# Patient Record
Sex: Female | Born: 1980 | Race: Black or African American | Hispanic: No | Marital: Married | Smoking: Never smoker
Health system: Southern US, Community
[De-identification: ages and names within clinical notes are randomized; demographics above are authoritative.]

## PROBLEM LIST (undated history)

## (undated) ENCOUNTER — Inpatient Hospital Stay (HOSPITAL_COMMUNITY): Payer: Self-pay

## (undated) DIAGNOSIS — J40 Bronchitis, not specified as acute or chronic: Secondary | ICD-10-CM

## (undated) DIAGNOSIS — O26899 Other specified pregnancy related conditions, unspecified trimester: Secondary | ICD-10-CM

## (undated) DIAGNOSIS — B977 Papillomavirus as the cause of diseases classified elsewhere: Secondary | ICD-10-CM

## (undated) DIAGNOSIS — R42 Dizziness and giddiness: Secondary | ICD-10-CM

## (undated) DIAGNOSIS — R12 Heartburn: Secondary | ICD-10-CM

## (undated) DIAGNOSIS — D649 Anemia, unspecified: Secondary | ICD-10-CM

## (undated) DIAGNOSIS — R87619 Unspecified abnormal cytological findings in specimens from cervix uteri: Secondary | ICD-10-CM

## (undated) DIAGNOSIS — O139 Gestational [pregnancy-induced] hypertension without significant proteinuria, unspecified trimester: Secondary | ICD-10-CM

## (undated) DIAGNOSIS — IMO0002 Reserved for concepts with insufficient information to code with codable children: Secondary | ICD-10-CM

---
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
## 2003-05-12 ENCOUNTER — Emergency Department (HOSPITAL_COMMUNITY): Admission: EM | Admit: 2003-05-12 | Discharge: 2003-05-13 | Payer: Self-pay | Admitting: Emergency Medicine

## 2005-09-13 ENCOUNTER — Ambulatory Visit (HOSPITAL_COMMUNITY): Admission: RE | Admit: 2005-09-13 | Discharge: 2005-09-13 | Payer: Self-pay | Admitting: Family Medicine

## 2005-09-28 ENCOUNTER — Inpatient Hospital Stay (HOSPITAL_COMMUNITY): Admission: AD | Admit: 2005-09-28 | Discharge: 2005-09-28 | Payer: Self-pay | Admitting: Family Medicine

## 2005-10-23 ENCOUNTER — Ambulatory Visit (HOSPITAL_COMMUNITY): Admission: RE | Admit: 2005-10-23 | Discharge: 2005-10-23 | Payer: Self-pay | Admitting: Family Medicine

## 2005-11-20 ENCOUNTER — Ambulatory Visit (HOSPITAL_COMMUNITY): Admission: RE | Admit: 2005-11-20 | Discharge: 2005-11-20 | Payer: Self-pay | Admitting: Family Medicine

## 2006-01-25 ENCOUNTER — Ambulatory Visit: Payer: Self-pay | Admitting: Obstetrics and Gynecology

## 2006-01-25 ENCOUNTER — Inpatient Hospital Stay (HOSPITAL_COMMUNITY): Admission: AD | Admit: 2006-01-25 | Discharge: 2006-01-25 | Payer: Self-pay | Admitting: Family Medicine

## 2006-03-21 ENCOUNTER — Inpatient Hospital Stay (HOSPITAL_COMMUNITY): Admission: AD | Admit: 2006-03-21 | Discharge: 2006-03-24 | Payer: Self-pay | Admitting: Obstetrics and Gynecology

## 2006-03-21 ENCOUNTER — Ambulatory Visit: Payer: Self-pay | Admitting: Obstetrics and Gynecology

## 2009-07-31 ENCOUNTER — Emergency Department (HOSPITAL_COMMUNITY): Admission: EM | Admit: 2009-07-31 | Discharge: 2009-07-31 | Payer: Self-pay | Admitting: Emergency Medicine

## 2010-02-21 ENCOUNTER — Emergency Department (HOSPITAL_COMMUNITY)
Admission: EM | Admit: 2010-02-21 | Discharge: 2010-02-21 | Payer: Self-pay | Source: Home / Self Care | Admitting: Emergency Medicine

## 2010-02-27 NOTE — L&D Delivery Note (Signed)
Delivery Note At 10:12 PM a viable female was delivered via Vaginal, Spontaneous Delivery (Presentation: Right Occiput Anterior).  APGAR: 9, 9; weight .   Placenta status: Intact, Spontaneous.  Cord:  with the following complications: none   Anesthesia: Epidural  Episiotomy: None Lacerations: 1st degree Suture Repair: 2.0 vicryl rapide Est. Blood Loss (mL): 250 ml  Mom to postpartum.  Baby to nursery-stable.  JACKSON-MOORE,Bernell Haynie A 01/04/2011, 10:33 PM

## 2010-05-16 LAB — DIFFERENTIAL
Basophils Relative: 1 % (ref 0–1)
Eosinophils Relative: 2 % (ref 0–5)
Lymphocytes Relative: 32 % (ref 12–46)
Monocytes Absolute: 0.7 10*3/uL (ref 0.1–1.0)
Neutro Abs: 2.5 10*3/uL (ref 1.7–7.7)
Neutrophils Relative %: 52 % (ref 43–77)

## 2010-05-16 LAB — CBC
Hemoglobin: 10.6 g/dL — ABNORMAL LOW (ref 12.0–15.0)
MCHC: 32.5 g/dL (ref 30.0–36.0)
Platelets: 284 10*3/uL (ref 150–400)
RDW: 17.4 % — ABNORMAL HIGH (ref 11.5–15.5)
WBC: 4.8 10*3/uL (ref 4.0–10.5)

## 2010-05-16 LAB — COMPREHENSIVE METABOLIC PANEL
Albumin: 3.7 g/dL (ref 3.5–5.2)
CO2: 24 mEq/L (ref 19–32)
Chloride: 109 mEq/L (ref 96–112)
Creatinine, Ser: 0.7 mg/dL (ref 0.4–1.2)
GFR calc Af Amer: >60 mL/min (ref >60)
GFR calc non Af Amer: >60 mL/min (ref >60)
Glucose, Bld: 113 mg/dL — ABNORMAL HIGH (ref 70–99)
Total Bilirubin: 0.3 mg/dL (ref 0.3–1.2)

## 2010-05-16 LAB — URINALYSIS, ROUTINE W REFLEX MICROSCOPIC
Bilirubin Urine: NEGATIVE
Glucose, UA: NEGATIVE mg/dL
Nitrite: NEGATIVE
Protein, ur: NEGATIVE mg/dL
Urobilinogen, UA: 1 mg/dL (ref 0.0–1.0)
pH: 6 (ref 5.0–8.0)

## 2010-05-16 LAB — URINE MICROSCOPIC-ADD ON

## 2010-05-25 ENCOUNTER — Inpatient Hospital Stay (HOSPITAL_COMMUNITY)
Admission: AD | Admit: 2010-05-25 | Discharge: 2010-05-25 | Disposition: A | Discharge status: Home / Self Care | Payer: Medicaid Other | Source: Ambulatory Visit | Attending: Obstetrics and Gynecology | Admitting: Obstetrics and Gynecology

## 2010-05-25 DIAGNOSIS — O210 Mild hyperemesis gravidarum: Secondary | ICD-10-CM | POA: Insufficient documentation

## 2010-05-25 DIAGNOSIS — A5901 Trichomonal vulvovaginitis: Secondary | ICD-10-CM

## 2010-05-25 DIAGNOSIS — O98819 Other maternal infectious and parasitic diseases complicating pregnancy, unspecified trimester: Secondary | ICD-10-CM | POA: Insufficient documentation

## 2010-05-25 LAB — WET PREP, GENITAL: Yeast Wet Prep HPF POC: NONE SEEN

## 2010-05-25 LAB — URINALYSIS, ROUTINE W REFLEX MICROSCOPIC
Specific Gravity, Urine: 1.015 (ref 1.005–1.030)
Urobilinogen, UA: 0.2 mg/dL (ref 0.0–1.0)

## 2010-05-25 LAB — POCT PREGNANCY, URINE: Preg Test, Ur: POSITIVE

## 2010-05-26 LAB — GC/CHLAMYDIA PROBE AMP, GENITAL: GC Probe Amp, Genital: NEGATIVE

## 2010-06-21 LAB — RPR: RPR: NONREACTIVE

## 2010-06-21 LAB — ABO/RH

## 2010-06-21 LAB — HEPATITIS B SURFACE ANTIGEN: Hepatitis B Surface Ag: NEGATIVE

## 2010-07-15 NOTE — Op Note (Signed)
Anna Neal, Anna Neal            ACCOUNT NO.:  [id number]   MEDICAL RECORD NO.:  [id number]          PATIENT TYPE:  INP   LOCATION:  9163                          FACILITY:  [facility]   PHYSICIAN:  Phil D. Okey Dupre, M.D.     DATE OF BIRTH:  December 02, 1980   DATE OF PROCEDURE:  03/22/2006  DATE OF DISCHARGE:                               OPERATIVE REPORT   PROCEDURE:  Low forceps delivery with left sulcus laceration repair.   PREOPERATIVE DIAGNOSES:  Term pregnancy, severe fetal bradycardia.   POSTOPERATIVE DIAGNOSES:  Term pregnancy, severe fetal bradycardia.   OBSTETRICIAN:  Phil D. Okey Dupre, M.D.   ASSISTANT:  Sylvan Cheese, M.D.   ANESTHESIA:  Epidural.   ESTIMATED BLOOD LOSS:  500 mL.   SPECIMENS TO PATHOLOGY:  None.   REASON FOR DELIVERY:  I was called to see the patient who was fully  dilated with the fetal heart down into the 70s, not corrected by  positional change, increased fluid or oxygen.  On examination the  patient was completely effaced, completely dilated with a vertex in a  ROT presentation and a -2 station.  I had the patient push very hard and  the patient was able to bring the vertex down to a +1.  I then applied  the Luikhart McClain forceps and using a Bill's traction bar easily  delivered the vertex.  The cord was doubly clamped, divided.  The baby  handed to the nurse and samples of blood taken from the cord for  analysis and pH and the placenta spontaneously removed.  Vagina was  explored.  There was a significant left lateral sulcus laceration  extending up 1 cm above the ischial spine that was closed with  continuous running 0 chromic suture on an atraumatic needle down to the  perineum.  The Area was observed for bleeding.  None was noted.  I was  worried about some oozing perhaps behind the repair so I put in a Foley  catheter and a Kerlix pack for several hours to prevent bleeding.  The  patient was in satisfactory condition at the end of the procedure as was  the baby.           ______________________________  Javier Glazier. Okey Dupre, M.D.     PDR/MEDQ  D:  03/22/2006  T:  03/22/2006  Job:  [id number]

## 2010-09-12 LAB — HIV ANTIBODY (ROUTINE TESTING W REFLEX): HIV: NONREACTIVE

## 2010-11-01 ENCOUNTER — Encounter (HOSPITAL_COMMUNITY): Payer: Self-pay | Admitting: *Deleted

## 2010-11-01 ENCOUNTER — Inpatient Hospital Stay (HOSPITAL_COMMUNITY)
Admission: AD | Admit: 2010-11-01 | Discharge: 2010-11-01 | Disposition: A | Discharge status: Home / Self Care | Payer: Medicaid Other | Source: Ambulatory Visit | Attending: Obstetrics | Admitting: Obstetrics

## 2010-11-01 DIAGNOSIS — R55 Syncope and collapse: Secondary | ICD-10-CM

## 2010-11-01 DIAGNOSIS — O9989 Other specified diseases and conditions complicating pregnancy, childbirth and the puerperium: Secondary | ICD-10-CM | POA: Insufficient documentation

## 2010-11-01 DIAGNOSIS — D649 Anemia, unspecified: Secondary | ICD-10-CM

## 2010-11-01 DIAGNOSIS — E860 Dehydration: Secondary | ICD-10-CM

## 2010-11-01 DIAGNOSIS — O99019 Anemia complicating pregnancy, unspecified trimester: Secondary | ICD-10-CM | POA: Insufficient documentation

## 2010-11-01 DIAGNOSIS — R42 Dizziness and giddiness: Secondary | ICD-10-CM | POA: Insufficient documentation

## 2010-11-01 HISTORY — DX: Papillomavirus as the cause of diseases classified elsewhere: B97.7

## 2010-11-01 HISTORY — DX: Dizziness and giddiness: R42

## 2010-11-01 HISTORY — DX: Bronchitis, not specified as acute or chronic: J40

## 2010-11-01 LAB — URINALYSIS, ROUTINE W REFLEX MICROSCOPIC
Glucose, UA: NEGATIVE mg/dL
Hgb urine dipstick: NEGATIVE
Ketones, ur: 15 mg/dL — AB
Leukocytes, UA: NEGATIVE
Protein, ur: NEGATIVE mg/dL
Specific Gravity, Urine: >1.03 — ABNORMAL HIGH (ref 1.005–1.030)
Urobilinogen, UA: 1 mg/dL (ref 0.0–1.0)

## 2010-11-01 LAB — CBC
HCT: 28.5 % — ABNORMAL LOW (ref 36.0–46.0)
MCHC: 30.5 g/dL (ref 30.0–36.0)
Platelets: 255 10*3/uL (ref 150–400)
RDW: 16.7 % — ABNORMAL HIGH (ref 11.5–15.5)

## 2010-11-01 MED ORDER — FERROUS SULFATE 325 (65 FE) MG PO TABS: 325.0000 mg | ORAL_TABLET | Freq: Three times a day (TID) | ORAL | Status: DC

## 2010-11-01 NOTE — Progress Notes (Signed)
Pt c/o lightheaded and dizziness x2 months. No abdominal pain or vaginal bleeding.

## 2010-11-01 NOTE — ED Provider Notes (Signed)
History   Pt presents today c/o dizzy spells. She states this has been happening on and off for the past month. She states she was bending over at work earlier today and when she stood up she became lightheaded and dizzy. She denies LOC. She denies abd pain, vag dc, bleeding, or any other sx at this time. She reports GFM.  Chief Complaint  Patient presents with  . Dizziness   HPI  OB History    Grav Para Term Preterm Abortions TAB SAB Ect Mult Living   2 1 1       1       Past Medical History  Diagnosis Date  . Bronchitis   . Vertigo   . HPV in female     Past Surgical History  Procedure Date  . No past surgeries     No family history on file.  History  Substance Use Topics  . Smoking status: Never Smoker   . Smokeless tobacco: Not on file  . Alcohol Use: No    Allergies: Allergies not on file  No prescriptions prior to admission    Review of Systems  Constitutional: Positive for malaise/fatigue. Negative for fever.  Eyes: Negative for blurred vision and double vision.  Cardiovascular: Negative for chest pain and palpitations.  Gastrointestinal: Negative for nausea, vomiting, abdominal pain and diarrhea.  Genitourinary: Negative for dysuria, urgency, frequency and hematuria.  Neurological: Positive for dizziness. Negative for tingling, sensory change, seizures, loss of consciousness and headaches.  Psychiatric/Behavioral: Negative for depression and suicidal ideas.   Physical Exam   Blood pressure 117/65, pulse 104, temperature 98.6 F (37 C), temperature source Oral, resp. rate 20, height 5\' 2"  (1.575 m), weight 224 lb (101.606 kg), SpO2 97.00%.  Physical Exam  Constitutional: She is oriented to person, place, and time. She appears well-developed and well-nourished. No distress.  HENT:  Head: Normocephalic and atraumatic.  Eyes: EOM are normal. Pupils are equal, round, and reactive to light.  Cardiovascular: Normal rate, regular rhythm and normal heart  sounds.  Exam reveals no gallop and no friction rub.   No murmur heard. Respiratory: Effort normal and breath sounds normal. No respiratory distress. She has no wheezes. She has no rales. She exhibits no tenderness.  GI: Soft. She exhibits no distension. There is no tenderness. There is no rebound and no guarding.  Neurological: She is alert and oriented to person, place, and time.  Skin: Skin is warm and dry. She is not diaphoretic.  Psychiatric: She has a normal mood and affect. Her behavior is normal. Judgment and thought content normal.    MAU Course  Procedures  Results for orders placed during the hospital encounter of 11/01/10 (from the past 24 hour(s))  URINALYSIS, ROUTINE W REFLEX MICROSCOPIC     Status: Abnormal   Collection Time   11/01/10 11:00 AM      Component Value Range   Color, Urine YELLOW  YELLOW    Appearance CLEAR  CLEAR    Specific Gravity, Urine >1.030 (*) 1.005 - 1.030    pH 6.0  5.0 - 8.0    Glucose, UA NEGATIVE  NEGATIVE (mg/dL)   Hgb urine dipstick NEGATIVE  NEGATIVE    Bilirubin Urine NEGATIVE  NEGATIVE    Ketones, ur 15 (*) NEGATIVE (mg/dL)   Protein, ur NEGATIVE  NEGATIVE (mg/dL)   Urobilinogen, UA 1.0  0.0 - 1.0 (mg/dL)   Nitrite NEGATIVE  NEGATIVE    Leukocytes, UA NEGATIVE  NEGATIVE   CBC  Status: Abnormal   Collection Time   11/01/10 11:38 AM      Component Value Range   WBC 8.7  4.0 - 10.5 (K/uL)   RBC 3.68 (*) 3.87 - 5.11 (MIL/uL)   Hemoglobin 8.7 (*) 12.0 - 15.0 (g/dL)   HCT 13.0 (*) 86.5 - 46.0 (%)   MCV 77.4 (*) 78.0 - 100.0 (fL)   MCH 23.6 (*) 26.0 - 34.0 (pg)   MCHC 30.5  30.0 - 36.0 (g/dL)   RDW 78.4 (*) 69.6 - 15.5 (%)   Platelets 255  150 - 400 (K/uL)     Assessment and Plan  Preg with dizziness: Pt likely had a vasovagal reaction this am. However, pt is also anemic and dehydrated. Will begin iron supplementation. Discussed adequate hydration. She has a f/u scheduled with Dr. Clearance Coots for [place] tomorrow. Discussed diet, activity,  risks, and precautions.  Clinton Gallant. Murdis Flitton III, DrHSc, MPAS, PA-C  11/01/2010, 11:41 AM   Henrietta Hoover, PA 11/01/10 1202

## 2010-12-05 LAB — STREP B DNA PROBE: GBS: NEGATIVE

## 2011-01-03 ENCOUNTER — Other Ambulatory Visit: Payer: Self-pay | Admitting: Obstetrics & Gynecology

## 2011-01-04 ENCOUNTER — Encounter (HOSPITAL_COMMUNITY): Payer: Self-pay

## 2011-01-04 ENCOUNTER — Encounter (HOSPITAL_COMMUNITY): Payer: Self-pay | Admitting: Anesthesiology

## 2011-01-04 ENCOUNTER — Inpatient Hospital Stay (HOSPITAL_COMMUNITY)
Admission: RE | Admit: 2011-01-04 | Discharge: 2011-01-06 | DRG: 775 | Disposition: A | Discharge status: Home / Self Care | Payer: Medicaid Other | Source: Ambulatory Visit | Attending: Obstetrics & Gynecology | Admitting: Obstetrics & Gynecology

## 2011-01-04 ENCOUNTER — Inpatient Hospital Stay (HOSPITAL_COMMUNITY): Payer: Medicaid Other | Admitting: Anesthesiology

## 2011-01-04 DIAGNOSIS — O481 Prolonged pregnancy: Secondary | ICD-10-CM | POA: Diagnosis present

## 2011-01-04 HISTORY — DX: Reserved for concepts with insufficient information to code with codable children: IMO0002

## 2011-01-04 HISTORY — DX: Unspecified abnormal cytological findings in specimens from cervix uteri: R87.619

## 2011-01-04 LAB — CBC
HCT: 33.3 % — ABNORMAL LOW (ref 36.0–46.0)
MCH: 25.7 pg — ABNORMAL LOW (ref 26.0–34.0)
MCV: 81.4 fL (ref 78.0–100.0)
RBC: 4.09 MIL/uL (ref 3.87–5.11)
WBC: 10 10*3/uL (ref 4.0–10.5)

## 2011-01-04 LAB — ABO/RH: RH Type: POSITIVE

## 2011-01-04 MED ORDER — DIPHENHYDRAMINE HCL 50 MG/ML IJ SOLN: 12.5000 mg | INTRAMUSCULAR | Status: DC | PRN

## 2011-01-04 MED ORDER — OXYTOCIN BOLUS FROM INFUSION
500.0000 mL | Freq: Once | INTRAVENOUS | Status: DC
  Filled 2011-01-04: qty 500

## 2011-01-04 MED ORDER — ONDANSETRON HCL 4 MG/2ML IJ SOLN: 4.0000 mg | Freq: Four times a day (QID) | INTRAMUSCULAR | Status: DC | PRN

## 2011-01-04 MED ORDER — LACTATED RINGERS IV SOLN
500.0000 mL | Freq: Once | INTRAVENOUS | Status: AC
  Administered 2011-01-04: 1000 mL via INTRAVENOUS

## 2011-01-04 MED ORDER — LIDOCAINE HCL 1.5 % IJ SOLN
INTRAMUSCULAR | Status: DC | PRN
  Administered 2011-01-04 (×2): 4 mL via EPIDURAL

## 2011-01-04 MED ORDER — LIDOCAINE HCL (PF) 1 % IJ SOLN
30.0000 mL | INTRAMUSCULAR | Status: DC | PRN
  Administered 2011-01-04: 30 mL via SUBCUTANEOUS
  Filled 2011-01-04 (×2): qty 30

## 2011-01-04 MED ORDER — FENTANYL 2.5 MCG/ML BUPIVACAINE 1/10 % EPIDURAL INFUSION (WH - ANES)
INTRAMUSCULAR | Status: DC | PRN
Start: 2011-01-04 — End: 2011-01-05
  Administered 2011-01-04: 13 mL/h via EPIDURAL

## 2011-01-04 MED ORDER — PHENYLEPHRINE 40 MCG/ML (10ML) SYRINGE FOR IV PUSH (FOR BLOOD PRESSURE SUPPORT)
80.0000 ug | PREFILLED_SYRINGE | INTRAVENOUS | Status: DC | PRN
  Filled 2011-01-04 (×2): qty 5

## 2011-01-04 MED ORDER — FLEET ENEMA 7-19 GM/118ML RE ENEM: 1.0000 | ENEMA | RECTAL | Status: DC | PRN

## 2011-01-04 MED ORDER — EPHEDRINE 5 MG/ML INJ
10.0000 mg | INTRAVENOUS | Status: DC | PRN
  Filled 2011-01-04 (×2): qty 4

## 2011-01-04 MED ORDER — BUTORPHANOL TARTRATE 2 MG/ML IJ SOLN: 1.0000 mg | INTRAMUSCULAR | Status: DC | PRN

## 2011-01-04 MED ORDER — IBUPROFEN 600 MG PO TABS
600.0000 mg | ORAL_TABLET | Freq: Four times a day (QID) | ORAL | Status: DC | PRN
  Administered 2011-01-05: 600 mg via ORAL
  Filled 2011-01-04: qty 1

## 2011-01-04 MED ORDER — OXYTOCIN 20 UNITS IN LACTATED RINGERS INFUSION - SIMPLE
1.0000 m[IU]/min | INTRAVENOUS | Status: DC
  Administered 2011-01-04: 2 m[IU]/min via INTRAVENOUS
  Administered 2011-01-04: 8 m[IU]/min via INTRAVENOUS
  Administered 2011-01-04: 6 m[IU]/min via INTRAVENOUS
  Filled 2011-01-04: qty 1000

## 2011-01-04 MED ORDER — OXYCODONE-ACETAMINOPHEN 5-325 MG PO TABS: 2.0000 | ORAL_TABLET | ORAL | Status: DC | PRN

## 2011-01-04 MED ORDER — CITRIC ACID-SODIUM CITRATE 334-500 MG/5ML PO SOLN: 30.0000 mL | ORAL | Status: DC | PRN

## 2011-01-04 MED ORDER — FENTANYL 2.5 MCG/ML BUPIVACAINE 1/10 % EPIDURAL INFUSION (WH - ANES)
14.0000 mL/h | INTRAMUSCULAR | Status: DC
  Administered 2011-01-04 (×2): 14 mL/h via EPIDURAL
  Filled 2011-01-04 (×3): qty 60

## 2011-01-04 MED ORDER — LACTATED RINGERS IV SOLN
500.0000 mL | INTRAVENOUS | Status: DC | PRN
  Administered 2011-01-04: 500 mL via INTRAVENOUS

## 2011-01-04 MED ORDER — LACTATED RINGERS IV SOLN
INTRAVENOUS | Status: DC
  Administered 2011-01-04: 15:00:00 via INTRAVENOUS

## 2011-01-04 MED ORDER — OXYTOCIN 20 UNITS IN LACTATED RINGERS INFUSION - SIMPLE
125.0000 mL/h | Freq: Once | INTRAVENOUS | Status: AC
  Administered 2011-01-04: 125 mL/h via INTRAVENOUS

## 2011-01-04 MED ORDER — ACETAMINOPHEN 325 MG PO TABS: 650.0000 mg | ORAL_TABLET | ORAL | Status: DC | PRN

## 2011-01-04 MED ORDER — PHENYLEPHRINE 40 MCG/ML (10ML) SYRINGE FOR IV PUSH (FOR BLOOD PRESSURE SUPPORT)
80.0000 ug | PREFILLED_SYRINGE | INTRAVENOUS | Status: DC | PRN
  Filled 2011-01-04: qty 5

## 2011-01-04 MED ORDER — TERBUTALINE SULFATE 1 MG/ML IJ SOLN: 0.2500 mg | Freq: Once | INTRAMUSCULAR | Status: AC | PRN

## 2011-01-04 MED ORDER — EPHEDRINE 5 MG/ML INJ
10.0000 mg | INTRAVENOUS | Status: DC | PRN
  Filled 2011-01-04: qty 4

## 2011-01-04 NOTE — Anesthesia Procedure Notes (Signed)
Epidural Patient location during procedure: OB Start time: 01/04/2011 12:25 PM  Staffing Anesthesiologist: Wang Granada A. Performed by: anesthesiologist   Preanesthetic Checklist Completed: patient identified, site marked, surgical consent, pre-op evaluation, timeout performed, IV checked, risks and benefits discussed and monitors and equipment checked  Epidural Patient position: sitting Prep: site prepped and draped and DuraPrep Patient monitoring: continuous pulse ox and blood pressure Approach: midline Injection technique: LOR air  Needle:  Needle type: Tuohy  Needle gauge: 17 G Needle length: 9 cm Needle insertion depth: 7 cm Catheter type: closed end flexible Catheter size: 19 Gauge Catheter at skin depth: 12 cm Test dose: negative and 1.5% lidocaine  Assessment Events: blood not aspirated, injection not painful, no injection resistance, negative IV test and no paresthesia  Additional Notes Patient is more comfortable after epidural dosed. Please see RN's note for documentation of vital signs and FHR which are stable.

## 2011-01-04 NOTE — H&P (Signed)
Anna Neal is a 30 y.o. female presenting for IOL. Maternal Medical History:  Reason for admission: Reason for Admission:   nauseaIOL.  Favorable Bishop's score.  Fetal activity: Perceived fetal activity is normal.    Prenatal complications: no prenatal complications   OB History    Grav Para Term Preterm Abortions TAB SAB Ect Mult Living   2 1 1       1      Past Medical History  Diagnosis Date  . Bronchitis   . Vertigo   . HPV in female   . Bronchitis   . Abnormal Pap smear    Past Surgical History  Procedure Date  . No past surgeries    Family History: family history is not on file. Social History:  reports that she has never smoked. She does not have any smokeless tobacco history on file. She reports that she does not drink alcohol or use illicit drugs.  Review of Systems  Constitutional: Negative for fever.  Eyes: Negative for blurred vision.  Respiratory: Negative for shortness of breath.   Gastrointestinal: Negative for nausea and vomiting.  Skin: Negative for rash.  Neurological: Negative for headaches.    Dilation: 8 Effacement (%): 80 Station: -2 Exam by:: dherr rn Blood pressure 114/76, pulse 86, temperature 98.2 F (36.8 C), temperature source Oral, resp. rate 20, height 5\' 2"  (1.575 m), weight 104.781 kg (231 lb), SpO2 98.00%. Maternal Exam:  Uterine Assessment: Contraction strength is mild.  Contraction frequency is irregular.   Abdomen: Patient reports no abdominal tenderness. Fetal presentation: vertex  Introitus: not evaluated.     Fetal Exam Fetal Monitor Review: Variability: moderate (6-25 bpm).   Pattern: accelerations present and no decelerations.    Fetal State Assessment: Category I - tracings are normal.     Physical Exam  Constitutional: She appears well-developed.  HENT:  Head: Normocephalic.  Neck: Neck supple. No thyromegaly present.  Cardiovascular: Normal rate and regular rhythm.   Respiratory: Breath sounds  normal.  GI: Soft. Bowel sounds are normal.  Skin: No rash noted.    Prenatal labs: ABO, Rh: --/Positive/-- (11/07 1608) Antibody: Negative (11/07 1608) Rubella: Immune (04/24 0000) RPR: NON REACTIVE (11/07 0710)  HBsAg: Negative (04/24 0000)  HIV: Non-reactive (07/16 0000)  GBS: Negative (10/08 0000)   Assessment/Plan: 30 y.o.  primipara at [redacted]w[redacted]d.  Favorable Bishop's score.  Admit AROM/Pitocin Anticipate an NSVD Anna Neal A 01/04/2011, 5:45 PM

## 2011-01-04 NOTE — Anesthesia Preprocedure Evaluation (Signed)
Anesthesia Evaluation  Patient identified by MRN, date of birth, ID band Patient awake    Reviewed: Allergy & Precautions, H&P , Patient's Chart, lab work & pertinent test results  Airway Mallampati: III TM Distance: >3 FB Neck ROM: full    Dental No notable dental hx. (+) Teeth Intact   Pulmonary neg pulmonary ROS,  clear to auscultation  Pulmonary exam normal       Cardiovascular neg cardio ROS regular Normal    Neuro/Psych Negative Neurological ROS  Negative Psych ROS   GI/Hepatic negative GI ROS, Neg liver ROS,   Endo/Other  Negative Endocrine ROSMorbid obesity  Renal/GU negative Renal ROS  Genitourinary negative   Musculoskeletal   Abdominal   Peds  Hematology negative hematology ROS (+)   Anesthesia Other Findings   Reproductive/Obstetrics (+) Pregnancy                           Anesthesia Physical Anesthesia Plan  ASA: III  Anesthesia Plan: Epidural   Post-op Pain Management:    Induction:   Airway Management Planned:   Additional Equipment:   Intra-op Plan:   Post-operative Plan:   Informed Consent: I have reviewed the patients History and Physical, chart, labs and discussed the procedure including the risks, benefits and alternatives for the proposed anesthesia with the patient or authorized representative who has indicated his/her understanding and acceptance.     Plan Discussed with: Anesthesiologist and Surgeon  Anesthesia Plan Comments:         Anesthesia Quick Evaluation

## 2011-01-04 NOTE — Progress Notes (Signed)
Updated on SVE. Stated to try a few pushes and if no movement to labor down until 2130

## 2011-01-05 LAB — CBC
HCT: 30.5 % — ABNORMAL LOW (ref 36.0–46.0)
Hemoglobin: 9.8 g/dL — ABNORMAL LOW (ref 12.0–15.0)
WBC: 15.2 10*3/uL — ABNORMAL HIGH (ref 4.0–10.5)

## 2011-01-05 MED ORDER — DIBUCAINE 1 % RE OINT: 1.0000 "application " | TOPICAL_OINTMENT | RECTAL | Status: DC | PRN

## 2011-01-05 MED ORDER — ZOLPIDEM TARTRATE 5 MG PO TABS: 5.0000 mg | ORAL_TABLET | Freq: Every evening | ORAL | Status: DC | PRN

## 2011-01-05 MED ORDER — WITCH HAZEL-GLYCERIN EX PADS: 1.0000 "application " | MEDICATED_PAD | CUTANEOUS | Status: DC | PRN

## 2011-01-05 MED ORDER — MEDROXYPROGESTERONE ACETATE 150 MG/ML IM SUSP: 150.0000 mg | INTRAMUSCULAR | Status: DC | PRN

## 2011-01-05 MED ORDER — FERROUS SULFATE 325 (65 FE) MG PO TABS
325.0000 mg | ORAL_TABLET | Freq: Two times a day (BID) | ORAL | Status: DC
  Administered 2011-01-05 – 2011-01-06 (×3): 325 mg via ORAL
  Filled 2011-01-05 (×4): qty 1

## 2011-01-05 MED ORDER — DIPHENHYDRAMINE HCL 25 MG PO CAPS: 25.0000 mg | ORAL_CAPSULE | Freq: Four times a day (QID) | ORAL | Status: DC | PRN

## 2011-01-05 MED ORDER — BENZOCAINE-MENTHOL 20-0.5 % EX AERO: 1.0000 "application " | INHALATION_SPRAY | CUTANEOUS | Status: DC | PRN

## 2011-01-05 MED ORDER — MEASLES, MUMPS & RUBELLA VAC ~~LOC~~ INJ
0.5000 mL | INJECTION | Freq: Once | SUBCUTANEOUS | Status: DC
  Filled 2011-01-05: qty 0.5

## 2011-01-05 MED ORDER — OXYCODONE-ACETAMINOPHEN 5-325 MG PO TABS: 1.0000 | ORAL_TABLET | ORAL | Status: DC | PRN

## 2011-01-05 MED ORDER — ONDANSETRON HCL 4 MG PO TABS: 4.0000 mg | ORAL_TABLET | ORAL | Status: DC | PRN

## 2011-01-05 MED ORDER — MAGNESIUM HYDROXIDE 400 MG/5ML PO SUSP: 30.0000 mL | ORAL | Status: DC | PRN

## 2011-01-05 MED ORDER — PRENATAL PLUS 27-1 MG PO TABS
1.0000 | ORAL_TABLET | Freq: Every day | ORAL | Status: DC
  Administered 2011-01-05 – 2011-01-06 (×2): 1 via ORAL
  Filled 2011-01-05 (×2): qty 1

## 2011-01-05 MED ORDER — ONDANSETRON HCL 4 MG/2ML IJ SOLN: 4.0000 mg | INTRAMUSCULAR | Status: DC | PRN

## 2011-01-05 MED ORDER — LANOLIN HYDROUS EX OINT: TOPICAL_OINTMENT | CUTANEOUS | Status: DC | PRN

## 2011-01-05 MED ORDER — TETANUS-DIPHTH-ACELL PERTUSSIS 5-2.5-18.5 LF-MCG/0.5 IM SUSP
0.5000 mL | Freq: Once | INTRAMUSCULAR | Status: AC
  Administered 2011-01-05: 0.5 mL via INTRAMUSCULAR
  Filled 2011-01-05: qty 0.5

## 2011-01-05 MED ORDER — IBUPROFEN 600 MG PO TABS
600.0000 mg | ORAL_TABLET | Freq: Four times a day (QID) | ORAL | Status: DC
  Administered 2011-01-05 – 2011-01-06 (×5): 600 mg via ORAL
  Filled 2011-01-05 (×5): qty 1

## 2011-01-05 MED ORDER — SENNOSIDES-DOCUSATE SODIUM 8.6-50 MG PO TABS
2.0000 | ORAL_TABLET | Freq: Every day | ORAL | Status: DC
  Administered 2011-01-05: 2 via ORAL

## 2011-01-05 NOTE — Anesthesia Postprocedure Evaluation (Signed)
  Anesthesia Post-op Note  Patient: Anna Neal  Procedure(s) Performed: * No procedures listed *  Patient Location: Mother/Baby  Anesthesia Type: Epidural  Level of Consciousness: awake  Airway and Oxygen Therapy: Patient Spontanous Breathing  Post-op Pain: none  Post-op Assessment: Patient's Cardiovascular Status Stable, Respiratory Function Stable, Patent Airway, No signs of Nausea or vomiting, Adequate PO intake and Pain level controlled  Post-op Vital Signs: Reviewed and stable  Complications: No apparent anesthesia complications

## 2011-01-05 NOTE — Progress Notes (Signed)
UR chart review completed.  

## 2011-01-05 NOTE — Progress Notes (Signed)
Post Partum Day 1 S/P spontaneous vaginal RH status/Rubella reviewed.  Feeding: breast Subjective: No HA, SOB, CP, F/C, breast symptoms. Normal vaginal bleeding, no clots.     Objective: BP 107/70  Pulse 93  Temp(Src) 98.4 F (36.9 C) (Oral)  Resp 20  Ht 5\' 2"  (1.575 m)  Wt 104.781 kg (231 lb)  BMI 42.25 kg/m2  SpO2 100%  Breastfeeding? Unknown   Physical Exam:  General: alert Lochia: appropriate Uterine Fundus: firm DVT Evaluation: No evidence of DVT seen on physical exam. Ext: No c/c/e  Basename 01/05/11 0510 01/04/11 0710  HGB 9.8* 10.5*  HCT 30.5* 33.3*      Assessment/Plan: 30 y.o.  PPD #1 .  normal postpartum exam Continue current postpartum care Ambulate   LOS: 1 day   JACKSON-MOORE,Hudson Lehmkuhl A 01/05/2011, 7:08 AM

## 2011-01-06 MED ORDER — OXYCODONE-ACETAMINOPHEN 5-325 MG PO TABS: 1.0000 | ORAL_TABLET | ORAL | Status: AC | PRN

## 2011-01-06 NOTE — Discharge Summary (Signed)
Obstetric Discharge Summary Reason for Admission: onset of labor Prenatal Procedures: none Intrapartum Procedures: spontaneous vaginal delivery Postpartum Procedures: none Complications-Operative and Postpartum: none Hemoglobin  Date Value Range Status  01/05/2011 9.8* 12.0-15.0 (g/dL) Final     HCT  Date Value Range Status  01/05/2011 30.5* 36.0-46.0 (%) Final    Discharge Diagnoses: Term Pregnancy-delivered  Discharge Information: Date: 01/06/2011 Activity: pelvic rest Diet: routine Medications: Percocet Condition: stable Instructions: refer to practice specific booklet Discharge to: home Follow-up Information    Follow up with Antionette Char A, MD. Call in 6 weeks.   Contact information:   [address], [address] [place] [address] [phone]          Newborn Data: Live born female  Birth Weight: 7 lb 8.6 oz (3420 g) APGAR: 9, 9  Home with mother.  Zavian Slowey A 01/06/2011, 7:29 AM

## 2012-12-18 ENCOUNTER — Encounter: Payer: Self-pay | Admitting: Obstetrics

## 2012-12-18 ENCOUNTER — Ambulatory Visit (INDEPENDENT_AMBULATORY_CARE_PROVIDER_SITE_OTHER): Payer: Medicaid Other | Admitting: Obstetrics & Gynecology

## 2012-12-18 VITALS — BP 120/84 | Temp 98.2°F | Wt 231.0 lb

## 2012-12-18 DIAGNOSIS — O99211 Obesity complicating pregnancy, first trimester: Secondary | ICD-10-CM

## 2012-12-18 DIAGNOSIS — Z3480 Encounter for supervision of other normal pregnancy, unspecified trimester: Secondary | ICD-10-CM | POA: Insufficient documentation

## 2012-12-18 DIAGNOSIS — Z3201 Encounter for pregnancy test, result positive: Secondary | ICD-10-CM

## 2012-12-18 DIAGNOSIS — Z23 Encounter for immunization: Secondary | ICD-10-CM

## 2012-12-18 DIAGNOSIS — E669 Obesity, unspecified: Secondary | ICD-10-CM

## 2012-12-18 DIAGNOSIS — O99210 Obesity complicating pregnancy, unspecified trimester: Secondary | ICD-10-CM | POA: Insufficient documentation

## 2012-12-18 DIAGNOSIS — Z3481 Encounter for supervision of other normal pregnancy, first trimester: Secondary | ICD-10-CM

## 2012-12-18 LAB — POCT URINALYSIS DIPSTICK
Bilirubin, UA: NEGATIVE
Ketones, UA: NEGATIVE
Protein, UA: NEGATIVE
Spec Grav, UA: 1.015

## 2012-12-18 MED ORDER — CITRANATAL HARMONY 30-1-260 MG PO CAPS: 1.0000 | ORAL_CAPSULE | Freq: Every day | ORAL | Status: DC

## 2012-12-18 NOTE — Progress Notes (Signed)
Pulse- 86 . Subjective:    Anna Neal is being seen today for her first obstetrical visit.  This is not a planned pregnancy. She is at [redacted]w[redacted]d gestation. Her obstetrical history is significant for obesity. Relationship with FOB: spouse, living together. Patient does intend to breast feed. Pregnancy history fully reviewed.  Menstrual History: OB History   Grav Para Term Preterm Abortions TAB SAB Ect Mult Living   3 2 2       2       Menarche age: 32  Patient's last menstrual period was 09/24/2012.    The following portions of the patient's history were reviewed and updated as appropriate: allergies, current medications, past family history, past medical history, past social history, past surgical history and problem list.  Review of Systems Pertinent items are noted in HPI.    Objective:   General Appearance:    Alert, cooperative, no distress, appears stated age  Head:    Normocephalic, without obvious abnormality, atraumatic  Eyes:    PERRL, conjunctiva/corneas clear, EOM's intact, fundi    benign, both eyes  Ears:    Normal TM's and external ear canals, both ears  Nose:   Nares normal, septum midline, mucosa normal, no drainage    or sinus tenderness  Throat:   Lips, mucosa, and tongue normal; teeth and gums normal  Neck:   Supple, symmetrical, trachea midline, no adenopathy;    thyroid:  no enlargement/tenderness/nodules; no carotid   bruit or JVD  Back:     Symmetric, no curvature, ROM normal, no CVA tenderness  Lungs:     Clear to auscultation bilaterally, respirations unlabored  Chest Wall:    No tenderness or deformity   Heart:    Regular rate and rhythm, S1 and S2 normal, no murmur, rub   or gallop  Breast Exam:    No tenderness, masses, or nipple abnormality  Abdomen:     Soft, non-tender, bowel sounds active all four quadrants,    no masses, no organomegaly  Genitalia:    Normal female without lesion, discharge or tenderness  Extremities:   Extremities normal,  atraumatic, no cyanosis or edema  Pulses:   2+ and symmetric all extremities  Skin:   Skin color, texture, turgor normal, no rashes or lesions  Lymph nodes:   Cervical, supraclavicular, and axillary nodes normal  Neurologic:   CNII-XII intact, normal strength, sensation and reflexes    throughout    Assessment:    Pregnancy at [redacted]w[redacted]d weeks    Plan:    Initial labs drawn. Prenatal vitamins.  Counseling provided regarding continued use of seat belts, cessation of alcohol consumption, smoking or use of illicit drugs; infection precautions i.e., influenza/TDAP immunizations, toxoplasmosis,CMV, parvovirus, listeria and varicella; workplace safety, exercise during pregnancy; routine dental care, safe medications, sexual activity, hot tubs, saunas, pools, travel, caffeine use, fish and methlymercury, potential toxins, hair treatments, varicose veins Weight gain recommendations reviewed: underweight/BMI< 18.5--> gain 28 - 40 lbs; normal weight/BMI 18.5 - 24.9--> gain 25 - 35 lbs; overweight/BMI 25 - 29.9--> gain 15 - 25 lbs; obese/BMI >30->gain  11 - 20 lbs Problem list reviewed and updated. FIRST/CF mutation testing declined Role of ultrasound in pregnancy discussed. Amniocentesis discussed: not indicated. Follow up in 6 weeks. 50% of 20 min visit spent on counseling and coordination of care.

## 2012-12-18 NOTE — Patient Instructions (Signed)
Prenatal Care  WHAT IS PRENATAL CARE?  Prenatal care means health care during your pregnancy, before your baby is born. Take care of yourself and your baby by:   Getting early prenatal care. If you know you are pregnant, or think you might be pregnant, call your caregiver as soon as possible. Schedule a visit for a general/prenatal examination.  Getting regular prenatal care. Follow your caregiver's schedule for blood and other necessary tests. Do not miss appointments.  Do everything you can to keep yourself and your baby healthy during your pregnancy.  Prenatal care should include evaluation of medical, dietary, educational, psychological, and social needs for the couple and the medical, surgical, and genetic history of the family of the mother and father.  Discuss with your caregiver:  Your medicines, prescription, over-the-counter, and herbal medicines.  Substance abuse, alcohol, smoking, and illegal drugs.  Domestic abuse and violence, if present.  Your immunizations.  Nutrition and diet.  Exercising.  Environment and occupational hazards, at home and at work.  History of sexually transmitted disease, both you and your partner.  Previous pregnancies. WHY IS PRENATAL CARE SO IMPORTANT?  By seeing you regularly, your caregiver has the chance to find problems early, so that they can be treated as soon as possible. Other problems might be prevented. Many studies have shown that early and regular prenatal care is important for the health of both mothers and their babies.  I AM THINKING ABOUT GETTING PREGNANT. HOW CAN I TAKE CARE OF MYSELF?  Taking care of yourself before you get pregnant helps you to have a healthy pregnancy. It also lowers your chances of having a baby born with a birth defect. Here are ways to take care of yourself before you get pregnant:   Eat healthy foods, exercise regularly (30 minutes per day for most days of the week is best), and get enough rest and  sleep. Talk to your caregiver about what kinds of foods and exercises are best for you.  Take 400 micrograms (mcg) of folic acid (one of the B vitamins) every day. The best way to do this is to take a daily multivitamin pill that contains this amount of folic acid. Getting enough of the synthetic (manufactured) form of folic acid every day before you get pregnant and during early pregnancy can help prevent certain birth defects. Many breakfast cereals and other grain products have folic acid added to them, but only certain cereals contain 400 mcg of folic acid per serving. Check the label on your multivitamin or cereal to find the amount of folic acid in the food.  See your caregiver for a complete check up before getting pregnant. Make sure that you have had all your immunization shots, especially for rubella ([place] measles). Rubella can cause serious birth defects. Chickenpox is another illness you want to avoid during pregnancy. If you have had chickenpox and rubella in the past, you should be immune to them.  Tell your caregiver about any prescription or non-prescription medicines (including herbal remedies) you are taking. Some medicines are not safe to take during pregnancy.  Stop smoking cigarettes, drinking alcohol, or taking illegal drugs. Ask your caregiver for help, if you need it. You can also get help with alcohol and drugs by talking with a member of your faith community, a counselor, or a trusted friend.  Discuss and treat any medical, social, or psychological problems before getting pregnant.  Discuss any history of genetic problems in the mother, father, and their families. Do  genetic testing before getting pregnant, when possible.  Discuss any physical or emotional abuse with your caregiver.  Discuss with your caregiver if you might be exposed to harmful chemicals on your job or where you live.  Discuss with your caregiver if you think your job or the hours you work may be  harmful and should be changed.  The father should be involved with the decision making and with all aspects of the pregnancy, labor, and delivery.  If you have medical insurance, make sure you are covered for pregnancy. I JUST FOUND OUT THAT I AM PREGNANT. HOW CAN I TAKE CARE OF MYSELF?  Here are ways to take care of yourself and the precious new life growing inside you:   Continue taking your multivitamin with 400 micrograms (mcg) of folic acid every day.  Get early and regular prenatal care. It does not matter if this is your first pregnancy or if you already have children. It is very important to see a caregiver during your pregnancy. Your caregiver will check at each visit to make sure that you and the baby are healthy. If there are any problems, action can be taken right away to help you and the baby.  Eat a healthy diet that includes:  Fruits.  Vegetables.  Foods low in saturated fat.  Grains.  Calcium-rich foods.  Drink 6 to 8 glasses of liquids a day.  Unless your caregiver tells you not to, try to be physically active for 30 minutes, most days of the week. If you are pressed for time, you can get your activity in through 10 minute segments, three times a day.  If you smoke, drink alcohol, or use drugs, STOP. These can cause long-term damage to your baby. Talk with your caregiver about steps to take to stop smoking. Talk with a member of your faith community, a counselor, a trusted friend, or your caregiver if you are concerned about your alcohol or drug use.  Ask your caregiver before taking any medicine, even over-the-counter medicines. Some medicines are not safe to take during pregnancy.  Get plenty of rest and sleep.  Avoid hot tubs and saunas during pregnancy.  Do not have X-rays taken, unless absolutely necessary and with the recommendation of your caregiver. A lead shield can be placed on your abdomen, to protect the baby when X-rays are taken in other parts of the  body.  Do not empty the cat litter when you are pregnant. It may contain a parasite that causes an infection called toxoplasmosis, which can cause birth defects. Also, use gloves when working in garden areas used by cats.  Do not eat uncooked or undercooked cheese, meats, or fish.  Stay away from toxic chemicals like:  Insecticides.  Solvents (some cleaners or paint thinners).  Lead.  Mercury.  Sexual relations may continue until the end of the pregnancy, unless you have a medical problem or there is a problem with the pregnancy and your caregiver tells you not to.  Do not wear high heel shoes, especially during the second half of the pregnancy. You can lose your balance and fall.  Do not take long trips, unless absolutely necessary. Be sure to see your caregiver before going on the trip.  Do not sit in one position for more than 2 hours, when on a trip.  Take a copy of your medical records when going on a trip.  Know where there is a hospital in the city you are visiting, in case of an  emergency.  Most dangerous household products will have pregnancy warnings on their labels. Ask your caregiver about products if you are unsure.  Limit or eliminate your caffeine intake from coffee, tea, sodas, medicines, and chocolate.  Many women continue working through pregnancy. Staying active might help you stay healthier. If you have a question about the safety or the hours you work at your particular job, talk with your caregiver.  Get informed:  Read books.  Watch videos.  Go to childbirth classes for you and the father.  Talk with experienced moms.  Ask your caregiver about childbirth education classes for you and your partner. Classes can help you and your partner prepare for the birth of your baby.  Ask about a pediatrician (baby doctor) and methods and pain medicine for labor, delivery, and possible Cesarean delivery (C-section). I AM NOT THINKING ABOUT GETTING PREGNANT  RIGHT NOW, BUT HEARD THAT ALL WOMEN SHOULD TAKE FOLIC ACID EVERY DAY?  All women of childbearing age, with even a remote chance of getting pregnant, should try to make sure they get enough folic acid. Many pregnancies are not planned. Many women do not know they are actually pregnant early in their pregnancies, and certain birth defects happen in the very early part of pregnancy. Taking 400 micrograms (mcg) of folic acid every day will help prevent certain birth defects that happen in the early part of pregnancy. If a woman begins taking vitamin pills in the second or third month of pregnancy, it may be too late to prevent birth defects. Folic acid may also have other health benefits for women, besides preventing birth defects.  HOW OFTEN SHOULD I SEE MY CAREGIVER DURING PREGNANCY?  Your caregiver will give you a schedule for your prenatal visits. You will have visits more often as you get closer to the end of your pregnancy. An average pregnancy lasts about 40 weeks.  A typical schedule includes visiting your caregiver:   About once each month, during your first 6 months of pregnancy.  Every 2 weeks, during the next 2 months.  Weekly in the last month, until the delivery date. Your caregiver will probably want to see you more often if:  You are over 35.  Your pregnancy is high risk, because you have certain health problems or problems with the pregnancy, such as:  Diabetes.  High blood pressure.  The baby is not growing on schedule, according to the dates of the pregnancy. Your caregiver will do special tests, to make sure you and the baby are not having any serious problems. WHAT HAPPENS DURING PRENATAL VISITS?   At your first prenatal visit, your caregiver will talk to you about you and your partner's health history and your family's health history, and will do a physical exam.  On your first visit, a physical exam will include checks of your blood pressure, height and weight, and an  exam of your pelvic organs. Your caregiver will do a Pap test if you have not had one recently, and will do cultures of your cervix to make sure there is no infection.  At each visit, there will be tests of your blood, urine, blood pressure, weight, and checking the progress of the baby.  Your caregiver will be able to tell you when to expect that your baby will be born.  Each visit is also a chance for you to learn about staying healthy during pregnancy and for asking questions.  Discuss whether you will be breastfeeding.  At your later prenatal  visits, your caregiver will check how you are doing and how the baby is developing. You may have a number of tests done as your pregnancy progresses.  Ultrasound exams are often used to check on the baby's growth and health.  You may have more urine and blood tests, as well as special tests, if needed. These may include amniocentesis (examine fluid in the pregnancy sac), stress tests (check how baby responds to contractions), biophysical profile (measures fetus well-being). Your caregiver will explain the tests and why they are necessary. I AM IN MY LATE THIRTIES, AND I WANT TO HAVE A CHILD NOW. SHOULD I DO ANYTHING SPECIAL?  As you get older, there is more chance of having a medical problem (high blood pressure), pregnancy problem (preeclampsia, problems with the placenta), miscarriage, or a baby born with a birth defect. However, most women in their late thirties and early forties have healthy babies. See your caregiver on a regular basis before you get pregnant and be sure to go for exams throughout your pregnancy. Your caregiver probably will want to do some special tests to check on you and your baby's health when you are pregnant.  Women today are often delaying having children until later in life, when they are in their thirties and forties. While many women in their thirties and forties have no difficulty getting pregnant, fertility does decline  with age. For women over 40 who cannot get pregnant after 6 months of trying, it is recommended that they see their caregiver for a fertility evaluation. It is not uncommon to have trouble becoming pregnant or experience infertility (inability to become pregnant after trying for one year). If you think that you or your partner may be infertile, you can discuss this with your caregiver. He or she can recommend treatments such as drugs, surgery, or assisted reproductive technology.  Document Released: 02/16/2003 Document Revised: 05/08/2011 Document Reviewed: 01/13/2009 [facility] Patient Information 2014 [place], [place].

## 2012-12-19 LAB — PAP IG, CT-NG, RFX HPV ASCU: Chlamydia Probe Amp: NEGATIVE

## 2012-12-19 LAB — OBSTETRIC PANEL
Antibody Screen: NEGATIVE
Basophils Absolute: 0 10*3/uL (ref 0.0–0.1)
Eosinophils Relative: 1 % (ref 0–5)
HCT: 31.4 % — ABNORMAL LOW (ref 36.0–46.0)
Hemoglobin: 9.9 g/dL — ABNORMAL LOW (ref 12.0–15.0)
Lymphocytes Relative: 21 % (ref 12–46)
Lymphs Abs: 2 10*3/uL (ref 0.7–4.0)
MCV: 73.2 fL — ABNORMAL LOW (ref 78.0–100.0)
Monocytes Absolute: 0.7 10*3/uL (ref 0.1–1.0)
Monocytes Relative: 7 % (ref 3–12)
Neutro Abs: 6.7 10*3/uL (ref 1.7–7.7)
RDW: 19.9 % — ABNORMAL HIGH (ref 11.5–15.5)
Rh Type: POSITIVE
Rubella: 2.11 Index — ABNORMAL HIGH (ref <0.90)
WBC: 9.5 10*3/uL (ref 4.0–10.5)

## 2012-12-19 LAB — VITAMIN D 25 HYDROXY (VIT D DEFICIENCY, FRACTURES): Vit D, 25-Hydroxy: 24 ng/mL — ABNORMAL LOW (ref 30–89)

## 2012-12-19 LAB — HIV ANTIBODY (ROUTINE TESTING W REFLEX): HIV: NONREACTIVE

## 2012-12-19 LAB — VARICELLA ZOSTER ANTIBODY, IGG: Varicella IgG: >4000 Index — ABNORMAL HIGH (ref <135.00)

## 2012-12-19 LAB — CULTURE, OB URINE: Colony Count: NO GROWTH

## 2012-12-23 ENCOUNTER — Encounter: Payer: Self-pay | Admitting: Obstetrics

## 2012-12-30 ENCOUNTER — Other Ambulatory Visit: Payer: Self-pay | Admitting: *Deleted

## 2012-12-30 DIAGNOSIS — D649 Anemia, unspecified: Secondary | ICD-10-CM

## 2012-12-30 DIAGNOSIS — E559 Vitamin D deficiency, unspecified: Secondary | ICD-10-CM

## 2012-12-30 MED ORDER — FUSION PLUS PO CAPS: 1.0000 | ORAL_CAPSULE | Freq: Every day | ORAL | Status: DC

## 2012-12-30 MED ORDER — OB COMPLETE PETITE 35-5-1-200 MG PO CAPS: 1.0000 | ORAL_CAPSULE | Freq: Every day | ORAL | Status: DC

## 2013-01-29 ENCOUNTER — Ambulatory Visit (INDEPENDENT_AMBULATORY_CARE_PROVIDER_SITE_OTHER): Payer: Medicaid Other | Admitting: Obstetrics

## 2013-01-29 ENCOUNTER — Encounter: Payer: Self-pay | Admitting: Obstetrics

## 2013-01-29 VITALS — BP 117/80 | Temp 97.4°F | Wt 233.4 lb

## 2013-01-29 DIAGNOSIS — Z3480 Encounter for supervision of other normal pregnancy, unspecified trimester: Secondary | ICD-10-CM

## 2013-01-29 DIAGNOSIS — Z1389 Encounter for screening for other disorder: Secondary | ICD-10-CM

## 2013-01-29 DIAGNOSIS — Z3482 Encounter for supervision of other normal pregnancy, second trimester: Secondary | ICD-10-CM

## 2013-01-29 LAB — POCT URINALYSIS DIPSTICK
Bilirubin, UA: NEGATIVE
Blood, UA: NEGATIVE
Nitrite, UA: NEGATIVE
Spec Grav, UA: 1.015
Urobilinogen, UA: NEGATIVE

## 2013-01-29 NOTE — Progress Notes (Signed)
Pulse- 90  Subjective:    Anna Neal is a 32 y.o. female being seen today for her obstetrical visit. She is at [redacted]w[redacted]d gestation. Patient reports no complaints. Fetal movement: normal.  Menstrual History: OB History   Grav Para Term Preterm Abortions TAB SAB Ect Mult Living   3 2 2       2       Menarche age: 58  Patient's last menstrual period was 09/24/2012.    The following portions of the patient's history were reviewed and updated as appropriate: allergies, current medications, past family history, past medical history, past social history, past surgical history and problem list.  Review of Systems Pertinent items are noted in HPI.   Objective:     BP 117/80  Temp(Src) 97.4 F (36.3 C)  Wt 233 lb 6.4 oz (105.87 kg)  LMP 09/24/2012 Uterine Size: size equals dates                                            Assessment:    Pregnancy 18 and 1/7 weeks   Plan:    Problem list reviewed and updated. Labs reviewed. AFP3 discussed: ordered. Role of ultrasound in pregnancy discussed; fetal survey: ordered. Amniocentesis discussed: not indicated. Follow up in 4 weeks. 50% of 15 minute visit spent on counseling and coordination of care.

## 2013-01-30 LAB — AFP, QUAD SCREEN
AFP: 34.1 IU/mL
Age Alone: 1:600 {titer}
Down Syndrome Scr Risk Est: 1:819 {titer}
HCG, Total: 25554 m[IU]/mL
Interpretation-AFP: NEGATIVE
MoM for AFP: 1.01
MoM for hCG: 1.77
Open Spina bifida: NEGATIVE
Tri 18 Scr Risk Est: NEGATIVE
uE3 Mom: 0.77

## 2013-02-04 ENCOUNTER — Ambulatory Visit (INDEPENDENT_AMBULATORY_CARE_PROVIDER_SITE_OTHER): Payer: Medicaid Other

## 2013-02-04 DIAGNOSIS — Z1389 Encounter for screening for other disorder: Secondary | ICD-10-CM

## 2013-02-04 DIAGNOSIS — Z363 Encounter for antenatal screening for malformations: Secondary | ICD-10-CM

## 2013-02-05 ENCOUNTER — Encounter: Payer: Self-pay | Admitting: Obstetrics

## 2013-02-05 LAB — US OB DETAIL + 14 WK

## 2013-02-18 ENCOUNTER — Ambulatory Visit (INDEPENDENT_AMBULATORY_CARE_PROVIDER_SITE_OTHER): Payer: Medicaid Other | Admitting: Advanced Practice Midwife

## 2013-02-18 VITALS — BP 111/71 | Temp 98.3°F | Wt 234.0 lb

## 2013-02-18 DIAGNOSIS — Z3482 Encounter for supervision of other normal pregnancy, second trimester: Secondary | ICD-10-CM

## 2013-02-18 DIAGNOSIS — Z3480 Encounter for supervision of other normal pregnancy, unspecified trimester: Secondary | ICD-10-CM

## 2013-02-18 DIAGNOSIS — A599 Trichomoniasis, unspecified: Secondary | ICD-10-CM

## 2013-02-18 LAB — POCT URINALYSIS DIPSTICK
Glucose, UA: NEGATIVE
Leukocytes, UA: NEGATIVE
Nitrite, UA: NEGATIVE
Protein, UA: NEGATIVE
Urobilinogen, UA: NEGATIVE

## 2013-02-18 MED ORDER — METRONIDAZOLE 500 MG PO TABS: 2000.0000 mg | ORAL_TABLET | Freq: Once | ORAL | Status: DC

## 2013-02-18 NOTE — Progress Notes (Signed)
HR - 71 Pt in office today for routine OB visit, denies concerns at this time

## 2013-02-18 NOTE — Progress Notes (Signed)
Subjective: Anna Neal is a 32 y.o. at 21 weeks  Patient denies vaginal leaking of fluid or bleeding, denies contractions.  Reports positive fetal movment.  Denies concerns today.  Objective: Filed Vitals:   02/18/13 1121  BP: 111/71  Temp: 98.3 F (36.8 C)   140 FHR @U  Fundal Height Fetal Position unknown  Assessment: Patient Active Problem List   Diagnosis Date Noted  . Trichimoniasis 02/18/2013  . Supervision of other normal pregnancy 12/18/2012  . Obesity complicating pregnancy 12/18/2012    Plan: Patient to return to clinic in 4 weeks Patient doing well. +Trich results on pap discussed. Treatment Rx given. Encouraged partner to get treatment and abstain until both have completed. Patient reports understanding. She gave verbal consent to share lab results in front of her visitor in the room and did not want them to leave. Reviewed warning signs in pregnancy.  Patient to call with concerns PRN.  Reviewed triage location.   20 min spent with patient greater than 80% spent in counseling and coordination of care.  Ashtynn Berke Wilson Singer CNM

## 2013-02-25 ENCOUNTER — Other Ambulatory Visit: Payer: Self-pay | Admitting: Advanced Practice Midwife

## 2013-02-25 DIAGNOSIS — Z3480 Encounter for supervision of other normal pregnancy, unspecified trimester: Secondary | ICD-10-CM

## 2013-03-03 ENCOUNTER — Ambulatory Visit (HOSPITAL_COMMUNITY)
Admission: RE | Admit: 2013-03-03 | Discharge: 2013-03-03 | Disposition: A | Discharge status: Home / Self Care | Payer: Medicaid Other | Source: Ambulatory Visit | Attending: Advanced Practice Midwife | Admitting: Advanced Practice Midwife

## 2013-03-03 ENCOUNTER — Encounter: Payer: Self-pay | Admitting: Advanced Practice Midwife

## 2013-03-03 ENCOUNTER — Other Ambulatory Visit: Payer: Self-pay | Admitting: Advanced Practice Midwife

## 2013-03-03 DIAGNOSIS — Z3480 Encounter for supervision of other normal pregnancy, unspecified trimester: Secondary | ICD-10-CM

## 2013-03-03 DIAGNOSIS — Z3689 Encounter for other specified antenatal screening: Secondary | ICD-10-CM | POA: Insufficient documentation

## 2013-03-03 LAB — US OB DETAIL + 14 WK

## 2013-03-18 ENCOUNTER — Ambulatory Visit (INDEPENDENT_AMBULATORY_CARE_PROVIDER_SITE_OTHER): Payer: Medicaid Other | Admitting: Obstetrics

## 2013-03-18 VITALS — BP 118/76 | Temp 98.0°F | Wt 237.0 lb

## 2013-03-18 DIAGNOSIS — Z3480 Encounter for supervision of other normal pregnancy, unspecified trimester: Secondary | ICD-10-CM

## 2013-03-18 LAB — POCT URINALYSIS DIPSTICK
Bilirubin, UA: NEGATIVE
Glucose, UA: NEGATIVE
Ketones, UA: NEGATIVE
LEUKOCYTES UA: NEGATIVE
NITRITE UA: NEGATIVE
PH UA: 6
RBC UA: NEGATIVE
Spec Grav, UA: 1.02
Urobilinogen, UA: NEGATIVE

## 2013-03-18 NOTE — Progress Notes (Signed)
Pulse 83 Pt states that she is having some swelling in her right knee making it hard to walk.

## 2013-03-19 ENCOUNTER — Encounter: Payer: Self-pay | Admitting: Obstetrics

## 2013-04-01 ENCOUNTER — Ambulatory Visit (INDEPENDENT_AMBULATORY_CARE_PROVIDER_SITE_OTHER): Payer: Medicaid Other | Admitting: Obstetrics

## 2013-04-01 ENCOUNTER — Encounter: Payer: Self-pay | Admitting: Obstetrics

## 2013-04-01 VITALS — BP 116/73 | Temp 97.7°F | Wt 238.0 lb

## 2013-04-01 DIAGNOSIS — Z3480 Encounter for supervision of other normal pregnancy, unspecified trimester: Secondary | ICD-10-CM

## 2013-04-01 LAB — POCT URINALYSIS DIPSTICK
Bilirubin, UA: NEGATIVE
Glucose, UA: NEGATIVE
Ketones, UA: NEGATIVE
Leukocytes, UA: NEGATIVE
NITRITE UA: NEGATIVE
PH UA: 5
Protein, UA: NEGATIVE
RBC UA: NEGATIVE
SPEC GRAV UA: 1.02
UROBILINOGEN UA: NEGATIVE

## 2013-04-01 NOTE — Progress Notes (Signed)
Pulse 97 Pt states that she is still having some right knee swelling.

## 2013-04-16 ENCOUNTER — Encounter: Payer: Self-pay | Admitting: Obstetrics

## 2013-04-16 ENCOUNTER — Ambulatory Visit (INDEPENDENT_AMBULATORY_CARE_PROVIDER_SITE_OTHER): Payer: Medicaid Other | Admitting: Obstetrics

## 2013-04-16 ENCOUNTER — Encounter: Payer: Medicaid Other | Admitting: Obstetrics

## 2013-04-16 VITALS — BP 110/69 | Temp 97.4°F | Wt 235.0 lb

## 2013-04-16 DIAGNOSIS — Z3480 Encounter for supervision of other normal pregnancy, unspecified trimester: Secondary | ICD-10-CM

## 2013-04-16 DIAGNOSIS — O3660X0 Maternal care for excessive fetal growth, unspecified trimester, not applicable or unspecified: Secondary | ICD-10-CM

## 2013-04-16 LAB — POCT URINALYSIS DIPSTICK
Bilirubin, UA: NEGATIVE
GLUCOSE UA: NEGATIVE
KETONES UA: NEGATIVE
Leukocytes, UA: NEGATIVE
Nitrite, UA: NEGATIVE
Protein, UA: NEGATIVE
RBC UA: NEGATIVE
SPEC GRAV UA: 1.02
UROBILINOGEN UA: NEGATIVE
pH, UA: 5

## 2013-04-16 NOTE — Progress Notes (Signed)
Pulse 101 Pt is doing well. 

## 2013-04-22 ENCOUNTER — Ambulatory Visit (HOSPITAL_COMMUNITY)
Admission: RE | Admit: 2013-04-22 | Discharge: 2013-04-22 | Disposition: A | Discharge status: Home / Self Care | Payer: Medicaid Other | Source: Ambulatory Visit | Attending: Obstetrics | Admitting: Obstetrics

## 2013-04-22 ENCOUNTER — Encounter (HOSPITAL_COMMUNITY): Payer: Self-pay | Admitting: *Deleted

## 2013-04-22 ENCOUNTER — Inpatient Hospital Stay (HOSPITAL_COMMUNITY)
Admission: AD | Admit: 2013-04-22 | Discharge: 2013-04-22 | Disposition: A | Discharge status: Home / Self Care | Payer: Medicaid Other | Source: Ambulatory Visit | Attending: Obstetrics | Admitting: Obstetrics

## 2013-04-22 DIAGNOSIS — O99891 Other specified diseases and conditions complicating pregnancy: Secondary | ICD-10-CM

## 2013-04-22 DIAGNOSIS — W19XXXA Unspecified fall, initial encounter: Secondary | ICD-10-CM

## 2013-04-22 DIAGNOSIS — O368390 Maternal care for abnormalities of the fetal heart rate or rhythm, unspecified trimester, not applicable or unspecified: Secondary | ICD-10-CM | POA: Insufficient documentation

## 2013-04-22 DIAGNOSIS — R55 Syncope and collapse: Secondary | ICD-10-CM

## 2013-04-22 DIAGNOSIS — S0990XA Unspecified injury of head, initial encounter: Secondary | ICD-10-CM

## 2013-04-22 DIAGNOSIS — Z3689 Encounter for other specified antenatal screening: Secondary | ICD-10-CM | POA: Insufficient documentation

## 2013-04-22 DIAGNOSIS — W1809XA Striking against other object with subsequent fall, initial encounter: Secondary | ICD-10-CM | POA: Insufficient documentation

## 2013-04-22 DIAGNOSIS — O2650 Maternal hypotension syndrome, unspecified trimester: Secondary | ICD-10-CM | POA: Insufficient documentation

## 2013-04-22 DIAGNOSIS — O9989 Other specified diseases and conditions complicating pregnancy, childbirth and the puerperium: Secondary | ICD-10-CM

## 2013-04-22 DIAGNOSIS — O3660X0 Maternal care for excessive fetal growth, unspecified trimester, not applicable or unspecified: Secondary | ICD-10-CM | POA: Insufficient documentation

## 2013-04-22 DIAGNOSIS — Y9229 Other specified public building as the place of occurrence of the external cause: Secondary | ICD-10-CM | POA: Insufficient documentation

## 2013-04-22 HISTORY — DX: Gestational (pregnancy-induced) hypertension without significant proteinuria, unspecified trimester: O13.9

## 2013-04-22 LAB — COMPREHENSIVE METABOLIC PANEL
ALBUMIN: 2.6 g/dL — AB (ref 3.5–5.2)
ALK PHOS: 80 U/L (ref 39–117)
ALT: 19 U/L (ref 0–35)
AST: 22 U/L (ref 0–37)
BILIRUBIN TOTAL: 0.3 mg/dL (ref 0.3–1.2)
BUN: 6 mg/dL (ref 6–23)
CHLORIDE: 100 meq/L (ref 96–112)
CO2: 21 mEq/L (ref 19–32)
Calcium: 8.5 mg/dL (ref 8.4–10.5)
Creatinine, Ser: 0.72 mg/dL (ref 0.50–1.10)
GFR calc Af Amer: >90 mL/min (ref >90)
GFR calc non Af Amer: >90 mL/min (ref >90)
Glucose, Bld: 113 mg/dL — ABNORMAL HIGH (ref 70–99)
POTASSIUM: 3.7 meq/L (ref 3.7–5.3)
Sodium: 132 mEq/L — ABNORMAL LOW (ref 137–147)
Total Protein: 6.7 g/dL (ref 6.0–8.3)

## 2013-04-22 LAB — URINALYSIS, ROUTINE W REFLEX MICROSCOPIC
BILIRUBIN URINE: NEGATIVE
Glucose, UA: NEGATIVE mg/dL
Hgb urine dipstick: NEGATIVE
Ketones, ur: 40 mg/dL — AB
LEUKOCYTES UA: NEGATIVE
NITRITE: NEGATIVE
PH: 6 (ref 5.0–8.0)
Protein, ur: NEGATIVE mg/dL
SPECIFIC GRAVITY, URINE: 1.015 (ref 1.005–1.030)
Urobilinogen, UA: 2 mg/dL — ABNORMAL HIGH (ref 0.0–1.0)

## 2013-04-22 LAB — GLUCOSE, CAPILLARY: Glucose-Capillary: 112 mg/dL — ABNORMAL HIGH (ref 70–99)

## 2013-04-22 LAB — CBC
HCT: 31.8 % — ABNORMAL LOW (ref 36.0–46.0)
Hemoglobin: 10.2 g/dL — ABNORMAL LOW (ref 12.0–15.0)
MCH: 25.6 pg — ABNORMAL LOW (ref 26.0–34.0)
MCHC: 32.1 g/dL (ref 30.0–36.0)
MCV: 79.9 fL (ref 78.0–100.0)
PLATELETS: 220 10*3/uL (ref 150–400)
RBC: 3.98 MIL/uL (ref 3.87–5.11)
RDW: 15.5 % (ref 11.5–15.5)
WBC: 6.6 10*3/uL (ref 4.0–10.5)

## 2013-04-22 MED ORDER — M.V.I. ADULT IV INJ
Freq: Once | INTRAVENOUS | Status: AC
  Administered 2013-04-22: 10:00:00 via INTRAVENOUS
  Filled 2013-04-22: qty 1000

## 2013-04-22 MED ORDER — ONDANSETRON 8 MG/NS 50 ML IVPB
8.0000 mg | Freq: Once | INTRAVENOUS | Status: AC
  Administered 2013-04-22: 8 mg via INTRAVENOUS
  Filled 2013-04-22: qty 8

## 2013-04-22 MED ORDER — LACTATED RINGERS IV BOLUS (SEPSIS)
1000.0000 mL | Freq: Once | INTRAVENOUS | Status: AC
  Administered 2013-04-22: 1000 mL via INTRAVENOUS

## 2013-04-22 MED ORDER — PROMETHAZINE HCL 25 MG PO TABS: 25.0000 mg | ORAL_TABLET | Freq: Four times a day (QID) | ORAL | Status: DC | PRN

## 2013-04-22 NOTE — MAU Note (Signed)
Became nauseated while getting hx, called for midlevel

## 2013-04-22 NOTE — MAU Provider Note (Signed)
History     CSN: [id number]  Arrival date and time: 04/22/13 7322   First Provider Initiated Contact with Patient 04/22/13 [phone]      Chief Complaint  Patient presents with  . Loss of Consciousness   HPI  Ms. Anna Neal is a 33 y.o. female G3P2002 at 12w0dwho presents with complaints of "passing out in radiology". Pt [place] "significant other witnessed the fall". Per patients significant other, pt did hit her head with the fall. Pt denies HA, denies blurred vision or vision changes, denies N/V at this time.  Pt has had a GI bug for approximately 24 hours; pt has vomited more times than she can count. She has had 4-5 episodes of diarrhea. She reports good fetal movement, denies LOF, vaginal bleeding, vaginal itching/burning, urinary symptoms or Ha.    OB History   Grav Para Term Preterm Abortions TAB SAB Ect Mult Living   _0 Past Medical History  Diagnosis Date  . Bronchitis   . Vertigo   . HPV in female   . Bronchitis   . Abnormal Pap smear   . Pregnancy induced hypertension     Past Surgical History  Procedure Laterality Date  . No past surgeries      Family History  Problem Relation Age of Onset  . Stroke Father   . Asthma Maternal Grandmother   . Alzheimer's disease Maternal Grandmother   . Cancer Maternal Grandfather     throat and lung    History  Substance Use Topics  . Smoking status: Never Smoker   . Smokeless tobacco: Never Used  . Alcohol Use: No    Allergies: No Known Allergies  Prescriptions prior to admission  Medication Sig Dispense Refill  . Iron-FA-B Cmp-C-Biot-Probiotic (FUSION PLUS) CAPS Take 1 capsule by mouth daily.  30 capsule  11  . Prenat-FeCbn-FeAspGl-FA-Omega (OB COMPLETE PETITE) 35-5-1-200 MG CAPS Take 1 capsule by mouth daily.  30 capsule  11   Results for orders placed during the hospital encounter of 04/22/13 (from the past 48 hour(s))  CBC     Status: Abnormal   Collection  Time    04/22/13  8:15 AM      Result Value Ref Range   WBC 6.6  4.0 - 10.5 K/uL   RBC 3.98  3.87 - 5.11 MIL/uL   Hemoglobin 10.2 (*) 12.0 - 15.0 g/dL   HCT 31.8 (*) 36.0 - 46.0 %   MCV 79.9  78.0 - 100.0 fL   MCH 25.6 (*) 26.0 - 34.0 pg   MCHC 32.1  30.0 - 36.0 g/dL   RDW 15.5  11.5 - 15.5 %   Platelets 220  150 - 400 K/uL  COMPREHENSIVE METABOLIC PANEL     Status: Abnormal   Collection Time    04/22/13  8:15 AM      Result Value Ref Range   Sodium 132 (*) 137 - 147 mEq/L   Potassium 3.7  3.7 - 5.3 mEq/L   Chloride 100  96 - 112 mEq/L   CO2 21  19 - 32 mEq/L   Glucose, Bld 113 (*) 70 - 99 mg/dL   BUN 6  6 - 23 mg/dL   Creatinine, Ser 0.72  0.50 - 1.10 mg/dL   Calcium 8.5  8.4 - 10.5 mg/dL   Total Protein 6.7  6.0 - 8.3 g/dL   Albumin  2.6 (*) 3.5 - 5.2 g/dL   AST 22  0 - 37 U/L   ALT 19  0 - 35 U/L   Alkaline Phosphatase 80  39 - 117 U/L   Total Bilirubin 0.3  0.3 - 1.2 mg/dL   GFR calc non Af Amer >90  >90 mL/min   GFR calc Af Amer >90  >90 mL/min   Comment: (NOTE)     The eGFR has been calculated using the CKD EPI equation.     This calculation has not been validated in all clinical situations.     eGFR's persistently <90 mL/min signify possible Chronic Kidney     Disease.  GLUCOSE, CAPILLARY     Status: Abnormal   Collection Time    04/22/13  8:16 AM      Result Value Ref Range   Glucose-Capillary 112 (*) 70 - 99 mg/dL  URINALYSIS, ROUTINE W REFLEX MICROSCOPIC     Status: Abnormal   Collection Time    04/22/13 11:15 AM      Result Value Ref Range   Color, Urine YELLOW  YELLOW   APPearance HAZY (*) CLEAR   Specific Gravity, Urine 1.015  1.005 - 1.030   pH 6.0  5.0 - 8.0   Glucose, UA NEGATIVE  NEGATIVE mg/dL   Hgb urine dipstick NEGATIVE  NEGATIVE   Bilirubin Urine NEGATIVE  NEGATIVE   Ketones, ur 40 (*) NEGATIVE mg/dL   Protein, ur NEGATIVE  NEGATIVE mg/dL   Urobilinogen, UA 2.0 (*) 0.0 - 1.0 mg/dL   Nitrite NEGATIVE  NEGATIVE   Leukocytes, UA NEGATIVE   NEGATIVE   Comment: MICROSCOPIC NOT DONE ON URINES WITH NEGATIVE PROTEIN, BLOOD, LEUKOCYTES, NITRITE, OR GLUCOSE <1000 mg/dL.    Review of Systems  Constitutional: Positive for malaise/fatigue. Negative for fever and chills.  Eyes: Negative for blurred vision and double vision.  Cardiovascular: Negative for chest pain.  Gastrointestinal: Positive for nausea, vomiting and diarrhea. Negative for abdominal pain and blood in stool.  Genitourinary: Negative for dysuria, urgency, frequency, hematuria and flank pain.       No vaginal discharge. No vaginal bleeding. No dysuria.   Neurological: Negative for dizziness and headaches.   Physical Exam   Blood pressure 102/66, pulse 91, temperature 98.2 F (36.8 C), temperature source Oral, resp. rate 16, last menstrual period 09/24/2012, SpO2 97.00%.  Physical Exam  Constitutional: She is oriented to person, place, and time. She appears well-developed and well-nourished. No distress.  Neck: Neck supple.  Cardiovascular: Normal rate and normal heart sounds.   Respiratory: Effort normal.  GI: Soft. She exhibits no distension. There is no tenderness.  Musculoskeletal: Normal range of motion.  Neurological: She is alert and oriented to person, place, and time. She has normal strength. She is not disoriented. No cranial nerve deficit or sensory deficit. GCS eye subscore is 4. GCS verbal subscore is 5. GCS motor subscore is 6.  Skin: Skin is warm. She is not diaphoretic.  Psychiatric: She has a normal mood and affect. Her behavior is normal. Judgment and thought content normal.   Initial tracing Fetal Tracing:  Baseline: 150 bpm  Variability: Moderate  Accelerations: 15x15 Decelerations: none  Toco: No contractions   Tracing from 0900-1030 One 15x15 accel Baseline 140-145 Moderate variability  Variable decelerations   MAU Course  Procedures None  MDM CBG 112 LR bolus Multivitamins bag 1 liter Zofran 8 mg IVPB Patient states she  feels significantly better following fluid and medications  Patient was here for  follow up US due to size greater than dates, and last [place] unable to finish anatomy scan BPP 8/8 Discussed with Dr. Jodi Mourning [place] report and physical exam. Discussed the need for Head CT due to fall with patient hitting her head; neuro exam negative. Currently patient denies HA, N/V, blurred vision, N/V. Pt is sitting up in the bed, patient able to walk to the bathroom without difficulty. Had a long discussion with patient and her significant other regarding warning signs and when to return patient to ER if symptoms arouse. Pt and her significant other both agreed with plan or care.   Assessment and Plan   A:  1. Fall   2.  Syncope episode   P:  Discharge home in stable condition Increase fluids slowly RX: Phenergan  Keep your next appointment with Dr. Sandi Carne, NP 04/22/2013, 4:13 PM

## 2013-04-22 NOTE — MAU Note (Signed)
Was registering at US, fainted. (US for growth) pt states was unable to keep anything down yesterday.  Did eat and drink this morning. fainted with other preg

## 2013-04-30 ENCOUNTER — Encounter: Payer: Self-pay | Admitting: Obstetrics

## 2013-04-30 ENCOUNTER — Ambulatory Visit (INDEPENDENT_AMBULATORY_CARE_PROVIDER_SITE_OTHER): Payer: Medicaid Other | Admitting: Obstetrics

## 2013-04-30 VITALS — BP 110/74 | Temp 97.9°F | Wt 236.0 lb

## 2013-04-30 DIAGNOSIS — Z3480 Encounter for supervision of other normal pregnancy, unspecified trimester: Secondary | ICD-10-CM

## 2013-04-30 DIAGNOSIS — K219 Gastro-esophageal reflux disease without esophagitis: Secondary | ICD-10-CM

## 2013-04-30 LAB — POCT URINALYSIS DIPSTICK
BILIRUBIN UA: NEGATIVE
Blood, UA: NEGATIVE
GLUCOSE UA: NEGATIVE
Ketones, UA: NEGATIVE
NITRITE UA: NEGATIVE
Spec Grav, UA: 1.02
UROBILINOGEN UA: NEGATIVE
pH, UA: 7

## 2013-04-30 MED ORDER — OMEPRAZOLE 20 MG PO CPDR: 20.0000 mg | DELAYED_RELEASE_CAPSULE | Freq: Two times a day (BID) | ORAL | Status: DC

## 2013-04-30 NOTE — Progress Notes (Signed)
Pulse: 84  Patient states she is having lower abdominal pressure. Patient denies any concerns.

## 2013-05-14 ENCOUNTER — Ambulatory Visit (INDEPENDENT_AMBULATORY_CARE_PROVIDER_SITE_OTHER): Payer: Medicaid Other | Admitting: Obstetrics

## 2013-05-14 VITALS — BP 115/77 | Temp 97.9°F | Wt 239.0 lb

## 2013-05-14 DIAGNOSIS — Z3480 Encounter for supervision of other normal pregnancy, unspecified trimester: Secondary | ICD-10-CM

## 2013-05-14 LAB — POCT URINALYSIS DIPSTICK
Bilirubin, UA: NEGATIVE
Blood, UA: NEGATIVE
Glucose, UA: NEGATIVE
Ketones, UA: NEGATIVE
LEUKOCYTES UA: NEGATIVE
NITRITE UA: NEGATIVE
Spec Grav, UA: 1.02
UROBILINOGEN UA: NEGATIVE
pH, UA: 6

## 2013-05-14 NOTE — Progress Notes (Signed)
Pulse: 92 Patient states she is having some middle to lower back pain. Patient denies any concerns.

## 2013-05-15 ENCOUNTER — Encounter: Payer: Self-pay | Admitting: Obstetrics

## 2013-05-29 ENCOUNTER — Ambulatory Visit (INDEPENDENT_AMBULATORY_CARE_PROVIDER_SITE_OTHER): Payer: Medicaid Other | Admitting: Obstetrics

## 2013-05-29 ENCOUNTER — Encounter: Payer: Medicaid Other | Admitting: Obstetrics

## 2013-05-29 VITALS — BP 106/70 | Temp 98.0°F | Wt 236.0 lb

## 2013-05-29 DIAGNOSIS — Z3480 Encounter for supervision of other normal pregnancy, unspecified trimester: Secondary | ICD-10-CM

## 2013-05-29 LAB — POCT URINALYSIS DIPSTICK
Bilirubin, UA: NEGATIVE
Blood, UA: NEGATIVE
Glucose, UA: NEGATIVE
KETONES UA: NEGATIVE
LEUKOCYTES UA: NEGATIVE
NITRITE UA: NEGATIVE
PH UA: 7
PROTEIN UA: NEGATIVE
Spec Grav, UA: 1.015
UROBILINOGEN UA: NEGATIVE

## 2013-05-29 NOTE — Progress Notes (Signed)
Pulse 85 Pt is doing well.

## 2013-05-31 LAB — STREP B DNA PROBE: STREP GROUP B AG: NEGATIVE

## 2013-06-02 ENCOUNTER — Encounter: Payer: Self-pay | Admitting: Obstetrics

## 2013-06-05 ENCOUNTER — Ambulatory Visit (INDEPENDENT_AMBULATORY_CARE_PROVIDER_SITE_OTHER): Payer: Medicaid Other | Admitting: Obstetrics

## 2013-06-05 ENCOUNTER — Encounter: Payer: Medicaid Other | Admitting: Obstetrics

## 2013-06-05 VITALS — BP 109/74 | Temp 98.0°F | Wt 241.6 lb

## 2013-06-05 DIAGNOSIS — Z3480 Encounter for supervision of other normal pregnancy, unspecified trimester: Secondary | ICD-10-CM

## 2013-06-05 LAB — POCT URINALYSIS DIPSTICK
Bilirubin, UA: NEGATIVE
GLUCOSE UA: NEGATIVE
Ketones, UA: NEGATIVE
Leukocytes, UA: NEGATIVE
NITRITE UA: NEGATIVE
PH UA: 6.5
Protein, UA: NEGATIVE
RBC UA: NEGATIVE
SPEC GRAV UA: 1.02
UROBILINOGEN UA: NEGATIVE

## 2013-06-05 NOTE — Progress Notes (Signed)
Pulse 78. Patient states no conerns.

## 2013-06-10 ENCOUNTER — Encounter: Payer: Medicaid Other | Admitting: Obstetrics

## 2013-06-11 ENCOUNTER — Encounter: Payer: Self-pay | Admitting: Obstetrics

## 2013-06-11 ENCOUNTER — Ambulatory Visit (INDEPENDENT_AMBULATORY_CARE_PROVIDER_SITE_OTHER): Payer: Medicaid Other | Admitting: Obstetrics

## 2013-06-11 VITALS — BP 108/75 | Temp 97.2°F | Wt 241.0 lb

## 2013-06-11 DIAGNOSIS — Z3480 Encounter for supervision of other normal pregnancy, unspecified trimester: Secondary | ICD-10-CM

## 2013-06-11 LAB — POCT URINALYSIS DIPSTICK
Bilirubin, UA: NEGATIVE
Blood, UA: NEGATIVE
Glucose, UA: NEGATIVE
KETONES UA: NEGATIVE
LEUKOCYTES UA: NEGATIVE
NITRITE UA: NEGATIVE
PH UA: 6
Spec Grav, UA: 1.02
UROBILINOGEN UA: NEGATIVE

## 2013-06-11 NOTE — Progress Notes (Signed)
Pulse: 95 Patient states she has been using Tums more then the Prilosec. Patient states she is having lower abdominal and pelvic pressure sometimes. Patient denies any concerns.

## 2013-06-16 ENCOUNTER — Encounter: Payer: Self-pay | Admitting: Obstetrics

## 2013-06-18 ENCOUNTER — Encounter: Payer: Medicaid Other | Admitting: Obstetrics

## 2013-06-19 ENCOUNTER — Ambulatory Visit (INDEPENDENT_AMBULATORY_CARE_PROVIDER_SITE_OTHER): Payer: Medicaid Other | Admitting: Obstetrics

## 2013-06-19 VITALS — BP 119/80 | HR 79 | Temp 98.0°F | Wt 243.0 lb

## 2013-06-19 DIAGNOSIS — Z3480 Encounter for supervision of other normal pregnancy, unspecified trimester: Secondary | ICD-10-CM

## 2013-06-20 ENCOUNTER — Encounter: Payer: Self-pay | Admitting: Obstetrics

## 2013-06-20 NOTE — Progress Notes (Signed)
Subjective:    Anna Neal is a 33 y.o. female being seen today for her obstetrical visit. She is at 4362w3d gestation. Patient reports occasional contractions. Fetal movement: normal.  Past Medical History  Diagnosis Date  . Bronchitis   . Vertigo   . HPV in female   . Bronchitis   . Abnormal Pap smear   . Pregnancy induced hypertension     Past Surgical History  Procedure Laterality Date  . No past surgeries      Current outpatient prescriptions:Iron-FA-B Cmp-C-Biot-Probiotic (FUSION PLUS) CAPS, Take 1 capsule by mouth daily., Disp: 30 capsule, Rfl: 11;  omeprazole (PRILOSEC) 20 MG capsule, Take 1 capsule (20 mg total) by mouth 2 (two) times daily before a meal., Disp: 60 capsule, Rfl: 5;  Prenat-FeCbn-FeAspGl-FA-Omega (OB COMPLETE PETITE) 35-5-1-200 MG CAPS, Take 1 capsule by mouth daily., Disp: 30 capsule, Rfl: 11 No Known Allergies  History  Substance Use Topics  . Smoking status: Never Smoker   . Smokeless tobacco: Never Used  . Alcohol Use: No    Family History  Problem Relation Age of Onset  . Stroke Father   . Asthma Maternal Grandmother   . Alzheimer's disease Maternal Grandmother   . Cancer Maternal Grandfather     throat and lung     Review of Systems Constitutional: negative for anorexia Gastrointestinal: negative for abdominal pain Genitourinary:negative for vaginal discharge Musculoskeletal:negative for back pain Behavioral/Psych: negative for depression and tobacco use   Objective:    BP 119/80  Pulse 79  Temp(Src) 98 F (36.7 C)  Wt 243 lb (110.224 kg)  LMP 09/24/2012 FHT: 140 BPM  Uterine Size: size equals dates  Presentations: unsure  Pelvic Exam: Deferred    Assessment:    Pregnancy @ 8562w3d weeks   Plan:   Plans for delivery: Vaginal anticipated; labs reviewed; problem list updated Counseling: Consent signed. Infant feeding: plans to breastfeed. Cigarette smoking: never smoked. L&D discussion: symptoms of labor, discussed when  to call, discussed what number to call, anesthetic/analgesic options reviewed and delivering clinician:  plans Physician. Postpartum supports and preparation: circumcision discussed and contraception plans discussed.  Follow up in 1 Week.

## 2013-06-26 ENCOUNTER — Ambulatory Visit (INDEPENDENT_AMBULATORY_CARE_PROVIDER_SITE_OTHER): Payer: Medicaid Other | Admitting: Obstetrics

## 2013-06-26 ENCOUNTER — Encounter: Payer: Self-pay | Admitting: Obstetrics

## 2013-06-26 VITALS — BP 121/77 | HR 81 | Temp 97.9°F | Wt 240.0 lb

## 2013-06-26 DIAGNOSIS — Z3480 Encounter for supervision of other normal pregnancy, unspecified trimester: Secondary | ICD-10-CM

## 2013-06-26 LAB — POCT URINALYSIS DIPSTICK
Bilirubin, UA: NEGATIVE
Blood, UA: NEGATIVE
GLUCOSE UA: NEGATIVE
Ketones, UA: NEGATIVE
Leukocytes, UA: NEGATIVE
NITRITE UA: NEGATIVE
Protein, UA: NEGATIVE
Spec Grav, UA: 1.01
UROBILINOGEN UA: NEGATIVE
pH, UA: 7

## 2013-06-26 NOTE — Progress Notes (Signed)
Subjective:    Anna Neal is a 33 y.o. female being seen today for her obstetrical visit. She is at 5875w2d gestation. Patient reports no complaints. Fetal movement: normal.  Problem List Items Addressed This Visit   Supervision of other normal pregnancy - Primary   Relevant Orders      POCT urinalysis dipstick (Completed)     Patient Active Problem List   Diagnosis Date Noted  . Trichimoniasis 02/18/2013  . Supervision of other normal pregnancy 12/18/2012  . Obesity complicating pregnancy 12/18/2012    Objective:    BP 121/77  Pulse 81  Temp(Src) 97.9 F (36.6 C)  Wt 240 lb (108.863 kg)  LMP 09/24/2012 FHT: 140 BPM  Uterine Size: size equals dates  Presentations: cephalic  Pelvic Exam:              Dilation: 2cm       Effacement: Long             Station:  -3    Consistency: soft            Position: posterior     Assessment:    Pregnancy @ 5575w2d weeks   Plan:   Plans for delivery: Vaginal anticipated; labs reviewed; problem list updated Counseling: Consent signed. Infant feeding: plans to breastfeed. Cigarette smoking: never smoked. L&D discussion: symptoms of labor, discussed when to call, discussed what number to call, anesthetic/analgesic options reviewed and delivering clinician:  plans Physician. Postpartum supports and preparation: circumcision discussed and contraception plans discussed.  Follow up in 1 Week.

## 2013-07-03 ENCOUNTER — Ambulatory Visit (INDEPENDENT_AMBULATORY_CARE_PROVIDER_SITE_OTHER): Payer: Medicaid Other | Admitting: Obstetrics

## 2013-07-03 ENCOUNTER — Telehealth (HOSPITAL_COMMUNITY): Payer: Self-pay | Admitting: *Deleted

## 2013-07-03 ENCOUNTER — Encounter: Payer: Self-pay | Admitting: Obstetrics

## 2013-07-03 VITALS — BP 125/79 | HR 88 | Temp 98.0°F | Wt 241.0 lb

## 2013-07-03 DIAGNOSIS — Z3480 Encounter for supervision of other normal pregnancy, unspecified trimester: Secondary | ICD-10-CM

## 2013-07-03 LAB — POCT URINALYSIS DIPSTICK
Glucose, UA: NEGATIVE
Ketones, UA: NEGATIVE
Leukocytes, UA: NEGATIVE
Nitrite, UA: NEGATIVE
RBC UA: NEGATIVE
Spec Grav, UA: 1.01
pH, UA: 6

## 2013-07-03 NOTE — Telephone Encounter (Signed)
Preadmission screen  

## 2013-07-03 NOTE — Progress Notes (Signed)
Subjective:    Anna Neal is a 33 y.o. female being seen today for her obstetrical visit. She is at 2274w2d gestation. Patient reports no complaints. Fetal movement: normal.  Problem List Items Addressed This Visit   Supervision of other normal pregnancy - Primary   Relevant Orders      POCT urinalysis dipstick (Completed)     Patient Active Problem List   Diagnosis Date Noted  . Trichimoniasis 02/18/2013  . Supervision of other normal pregnancy 12/18/2012  . Obesity complicating pregnancy 12/18/2012    Objective:    BP 125/79  Pulse 88  Temp(Src) 98 F (36.7 C)  Wt 241 lb (109.317 kg)  LMP 09/24/2012 FHT:  140 BPM  Uterine Size: size equals dates  Presentation: cephalic  Pelvic Exam:              Dilation: 2cm       Effacement: 50%   Station:  -3     Consistency: soft            Position: posterior     Assessment:    Pregnancy @ 4074w2d  weeks   NST Reactive  Plan:    Postdates management: discussed fetal surveillance and induction, discussed fetal movement, NST reactive, biophysical profile ordered. Induction: scheduled for 07-07-13, written information given.

## 2013-07-07 ENCOUNTER — Inpatient Hospital Stay (HOSPITAL_COMMUNITY)
Admission: RE | Admit: 2013-07-07 | Discharge: 2013-07-12 | DRG: 765 | Disposition: A | Discharge status: Home / Self Care | Payer: Medicaid Other | Source: Ambulatory Visit | Attending: Obstetrics & Gynecology | Admitting: Obstetrics & Gynecology

## 2013-07-07 ENCOUNTER — Encounter (HOSPITAL_COMMUNITY): Payer: Self-pay

## 2013-07-07 VITALS — BP 107/67 | HR 93 | Temp 98.6°F | Resp 18 | Ht 62.0 in | Wt 241.0 lb

## 2013-07-07 DIAGNOSIS — Z6841 Body Mass Index (BMI) 40.0 and over, adult: Secondary | ICD-10-CM

## 2013-07-07 DIAGNOSIS — E669 Obesity, unspecified: Secondary | ICD-10-CM | POA: Diagnosis present

## 2013-07-07 DIAGNOSIS — O34599 Maternal care for other abnormalities of gravid uterus, unspecified trimester: Secondary | ICD-10-CM | POA: Diagnosis present

## 2013-07-07 DIAGNOSIS — Z820 Family history of epilepsy and other diseases of the nervous system: Secondary | ICD-10-CM

## 2013-07-07 DIAGNOSIS — D649 Anemia, unspecified: Secondary | ICD-10-CM | POA: Diagnosis present

## 2013-07-07 DIAGNOSIS — Z823 Family history of stroke: Secondary | ICD-10-CM

## 2013-07-07 DIAGNOSIS — O98519 Other viral diseases complicating pregnancy, unspecified trimester: Secondary | ICD-10-CM | POA: Diagnosis present

## 2013-07-07 DIAGNOSIS — O480 Post-term pregnancy: Principal | ICD-10-CM | POA: Diagnosis present

## 2013-07-07 DIAGNOSIS — O9902 Anemia complicating childbirth: Secondary | ICD-10-CM | POA: Diagnosis present

## 2013-07-07 DIAGNOSIS — A599 Trichomoniasis, unspecified: Secondary | ICD-10-CM

## 2013-07-07 DIAGNOSIS — N80109 Endometriosis of ovary, unspecified side, unspecified depth: Secondary | ICD-10-CM | POA: Diagnosis present

## 2013-07-07 DIAGNOSIS — N801 Endometriosis of ovary: Secondary | ICD-10-CM | POA: Diagnosis present

## 2013-07-07 DIAGNOSIS — O99214 Obesity complicating childbirth: Secondary | ICD-10-CM

## 2013-07-07 DIAGNOSIS — Z3480 Encounter for supervision of other normal pregnancy, unspecified trimester: Secondary | ICD-10-CM

## 2013-07-07 DIAGNOSIS — B977 Papillomavirus as the cause of diseases classified elsewhere: Secondary | ICD-10-CM | POA: Diagnosis present

## 2013-07-07 DIAGNOSIS — O99210 Obesity complicating pregnancy, unspecified trimester: Secondary | ICD-10-CM

## 2013-07-07 HISTORY — DX: Anemia, unspecified: D64.9

## 2013-07-07 LAB — CBC
HCT: 35.1 % — ABNORMAL LOW (ref 36.0–46.0)
Hemoglobin: 11.3 g/dL — ABNORMAL LOW (ref 12.0–15.0)
MCH: 26.6 pg (ref 26.0–34.0)
MCHC: 32.2 g/dL (ref 30.0–36.0)
MCV: 82.6 fL (ref 78.0–100.0)
PLATELETS: 268 10*3/uL (ref 150–400)
RBC: 4.25 MIL/uL (ref 3.87–5.11)
RDW: 16.8 % — AB (ref 11.5–15.5)
WBC: 9.8 10*3/uL (ref 4.0–10.5)

## 2013-07-07 LAB — TYPE AND SCREEN
ABO/RH(D): A POS
Antibody Screen: NEGATIVE

## 2013-07-07 MED ORDER — MISOPROSTOL 25 MCG QUARTER TABLET
25.0000 ug | ORAL_TABLET | ORAL | Status: DC | PRN
  Administered 2013-07-07: 25 ug via VAGINAL
  Filled 2013-07-07: qty 0.25

## 2013-07-07 MED ORDER — CITRIC ACID-SODIUM CITRATE 334-500 MG/5ML PO SOLN
30.0000 mL | ORAL | Status: DC | PRN
  Administered 2013-07-09: 30 mL via ORAL
  Filled 2013-07-07 (×2): qty 15

## 2013-07-07 MED ORDER — LIDOCAINE HCL (PF) 1 % IJ SOLN: 30.0000 mL | INTRAMUSCULAR | Status: DC | PRN

## 2013-07-07 MED ORDER — LACTATED RINGERS IV SOLN
INTRAVENOUS | Status: DC
  Administered 2013-07-07 – 2013-07-09 (×5): via INTRAVENOUS

## 2013-07-07 MED ORDER — OXYTOCIN 40 UNITS IN LACTATED RINGERS INFUSION - SIMPLE MED: 62.5000 mL/h | INTRAVENOUS | Status: DC

## 2013-07-07 MED ORDER — ACETAMINOPHEN 325 MG PO TABS: 650.0000 mg | ORAL_TABLET | ORAL | Status: DC | PRN

## 2013-07-07 MED ORDER — IBUPROFEN 600 MG PO TABS: 600.0000 mg | ORAL_TABLET | Freq: Four times a day (QID) | ORAL | Status: DC | PRN

## 2013-07-07 MED ORDER — TERBUTALINE SULFATE 1 MG/ML IJ SOLN
0.2500 mg | Freq: Once | INTRAMUSCULAR | Status: AC | PRN
  Filled 2013-07-07: qty 1

## 2013-07-07 MED ORDER — OXYTOCIN 40 UNITS IN LACTATED RINGERS INFUSION - SIMPLE MED
1.0000 m[IU]/min | INTRAVENOUS | Status: DC
  Administered 2013-07-08: 5 m[IU]/min via INTRAVENOUS
  Administered 2013-07-08: 1 m[IU]/min via INTRAVENOUS
  Administered 2013-07-08: 6 m[IU]/min via INTRAVENOUS
  Administered 2013-07-09: 2 m[IU]/min via INTRAVENOUS
  Filled 2013-07-07 (×3): qty 1000

## 2013-07-07 MED ORDER — OXYCODONE-ACETAMINOPHEN 5-325 MG PO TABS: 1.0000 | ORAL_TABLET | ORAL | Status: DC | PRN

## 2013-07-07 MED ORDER — ONDANSETRON HCL 4 MG/2ML IJ SOLN: 4.0000 mg | Freq: Four times a day (QID) | INTRAMUSCULAR | Status: DC | PRN

## 2013-07-07 MED ORDER — OXYTOCIN BOLUS FROM INFUSION: 500.0000 mL | INTRAVENOUS | Status: DC

## 2013-07-07 MED ORDER — BUTORPHANOL TARTRATE 1 MG/ML IJ SOLN: 1.0000 mg | INTRAMUSCULAR | Status: DC | PRN

## 2013-07-07 MED ORDER — LACTATED RINGERS IV SOLN
500.0000 mL | INTRAVENOUS | Status: DC | PRN
  Administered 2013-07-08: 500 mL via INTRAVENOUS

## 2013-07-08 ENCOUNTER — Encounter (HOSPITAL_COMMUNITY): Payer: Self-pay

## 2013-07-08 LAB — ABO/RH: ABO/RH(D): A POS

## 2013-07-08 LAB — RPR

## 2013-07-08 MED ORDER — TERBUTALINE SULFATE 1 MG/ML IJ SOLN
0.2500 mg | Freq: Once | INTRAMUSCULAR | Status: AC
  Administered 2013-07-08: 0.25 mg via SUBCUTANEOUS

## 2013-07-08 NOTE — H&P (Signed)
Anna Neal is a 33 y.o. female presenting for Induction of labor for post dates.  History OB History   Grav Para Term Preterm Abortions TAB SAB Ect Mult Living   3 2 2       2      Past Medical History  Diagnosis Date  . Bronchitis   . Vertigo   . HPV in female   . Abnormal Pap smear   . Pregnancy induced hypertension     with previous pregnancy  . Anemia    Past Surgical History  Procedure Laterality Date  . No past surgeries     Family History: family history includes Alzheimer's disease in her maternal grandmother; Asthma in her maternal grandmother; Cancer in her maternal grandfather; Stroke in her father. Social History:  reports that she has never smoked. She has never used smokeless tobacco. She reports that she does not drink alcohol or use illicit drugs.   Prenatal Transfer Tool  Maternal Diabetes: No Genetic Screening: Normal Maternal Ultrasounds/Referrals: Normal Fetal Ultrasounds or other Referrals:  None Maternal Substance Abuse:  No Significant Maternal Medications:  None Significant Maternal Lab Results:  None Other Comments:  None  Review of Systems  Constitutional: Negative.   HENT: Negative.   Eyes: Negative.   Respiratory: Negative.   Cardiovascular: Negative.   Gastrointestinal: Negative.   Genitourinary: Negative.   Musculoskeletal: Negative.   Skin: Negative.   Neurological: Negative.   Endo/Heme/Allergies: Negative.   Psychiatric/Behavioral: Negative.     Dilation: 2 Effacement (%): 50 Station: -3 Exam by:: Renee Kendrick RNC  Blood pressure 103/64, pulse 84, temperature 98.5 F (36.9 C), temperature source Oral, resp. rate 20, height 5\' 2"  (1.575 m), weight 241 lb (109.317 kg), last menstrual period 09/24/2012, SpO2 98.00%. Maternal Exam:  Uterine Assessment: Contraction strength is mild.  Contraction frequency is irregular and rare.   Abdomen: Patient reports no abdominal tenderness. Fundal height is 40.   Estimated fetal  weight is 3500.    Introitus: Normal vulva. Normal vagina.  Ferning test: not done.  Nitrazine test: not done.  Pelvis: adequate for delivery.   Cervix: Cervix evaluated by sterile speculum exam and digital exam.     Fetal Exam Fetal Monitor Review: Mode: ultrasound.   Baseline rate: 150.  Variability: moderate (6-25 bpm).   Pattern: accelerations present.    Fetal State Assessment: Category I - tracings are normal.     Physical Exam  Constitutional: She is oriented to person, place, and time. She appears well-developed and well-nourished.  HENT:  Head: Normocephalic.  Eyes: Pupils are equal, round, and reactive to light.  Neck: Normal range of motion. Neck supple.  Cardiovascular: Normal rate, regular rhythm and normal heart sounds.   Respiratory: Effort normal and breath sounds normal.  GI: Soft.  Genitourinary: Vagina normal and uterus normal.  Musculoskeletal: Normal range of motion.  Neurological: She is alert and oriented to person, place, and time. She has normal reflexes.  Skin: Skin is warm and dry.  Psychiatric: She has a normal mood and affect. Her behavior is normal.    Prenatal labs: ABO, Rh: --/--/A POS, A POS (05/11 2210) Antibody: NEG (05/11 2210) Rubella: 2.11 (10/22 1040) RPR: NON REAC (05/11 2210)  HBsAg: NEGATIVE (10/22 1040)  HIV: NON REACTIVE (10/22 1040)  GBS: NEGATIVE (04/02 1328)   Assessment/Plan: Admit to L&D IOL, start w/ cytotec Q 4 PRN, per induction protocol Continuous EFM Anticipate NSVD MD Clearance CootsHarper aware of patient status and agrees to plan of care  Tijana Walder Dessa PhiHowell Senaya Dicenso 07/08/2013, 12:28 PM

## 2013-07-08 NOTE — Progress Notes (Addendum)
Anna Neal is a 33 y.o. G3P2002 at 3634w0d by ultrasound admitted for induction of labor due to Post dates. Due date 07/01/2013.  Subjective: Patient comfortable. Has no questions or concerns.   Objective: BP 103/64  Pulse 84  Temp(Src) 98.5 F (36.9 C) (Oral)  Resp 20  Ht 5\' 2"  (1.575 m)  Wt 241 lb (109.317 kg)  BMI 44.07 kg/m2  SpO2 98%  LMP 09/24/2012      FHT:  FHR: 130 bpm, variability: moderate,  accelerations:  Present,  decelerations:  Absent UC:   irregular, every 2-10 minutes SVE:   Dilation: 2 Effacement (%): 50 Station: -3 Exam by:: Laverta Baltimoreenee Kendrick RNC   Labs: Lab Results  Component Value Date   WBC 9.8 07/07/2013   HGB 11.3* 07/07/2013   HCT 35.1* 07/07/2013   MCV 82.6 07/07/2013   PLT 268 07/07/2013    Assessment / Plan: Induction of labor due to postterm,  progressing well on pitocin  Not yet in active labor Anticipate active labor and NSVD, consult PRN MD Clearance CootsHarper aware of patient status  Labor: Progressing normally Preeclampsia:  NA Fetal Wellbeing:  Category I Pain Control:  Labor support without medications I/D:  n/a Anticipated MOD:  NSVD  Monroe Qin Dessa PhiHowell Adna Nofziger 07/08/2013, 12:33 PM

## 2013-07-08 NOTE — Progress Notes (Addendum)
Anna Neal is a 33 y.o. G3P2002 at 5591w0d by ultrasound admitted for induction of labor due to Post dates. Due date 07/01/2013.  Subjective: Feeling stronger contractions 5/10 in pain  Objective: BP 123/69  Pulse 83  Temp(Src) 98.4 F (36.9 C) (Oral)  Resp 20  Ht 5\' 2"  (1.575 m)  Wt 241 lb (109.317 kg)  BMI 44.07 kg/m2  SpO2 98%  LMP 09/24/2012      FHT:  FHR: 140 bpm, variability: moderate,  accelerations:  Present,  decelerations:  Absent UC:   regular, every 2-4 minutes, toco reposistioned SVE:   Dilation: 2 Effacement (%): 50 Station: -3 Exam by:: Anna Neal RNC   Labs: Lab Results  Component Value Date   WBC 9.8 07/07/2013   HGB 11.3* 07/07/2013   HCT 35.1* 07/07/2013   MCV 82.6 07/07/2013   PLT 268 07/07/2013    Assessment / Plan: Induction of Labor for post dates. Normal progression, not yet in active labor.  Labor: Progressing normally Preeclampsia:  NA Fetal Wellbeing:  Category I Pain Control:  Labor support without medications I/D:  n/a Anticipated MOD:  NSVD  Ritisha Deitrick Dessa PhiHowell Hector Venne 07/08/2013, 4:45 PM

## 2013-07-08 NOTE — Progress Notes (Signed)
Anna ArcherLadallas A Neal is a 33 y.o. G3P2002 at 10675w0d by LMP admitted for induction of labor due to Post dates. Due date 07-01-13.  Subjective:   Objective: BP 116/59  Pulse 77  Temp(Src) 98.5 F (36.9 C) (Oral)  Resp 18  Ht 5\' 2"  (1.575 m)  Wt 241 lb (109.317 kg)  BMI 44.07 kg/m2  SpO2 98%  LMP 09/24/2012      FHT:  FHR: 135 bpm, variability: moderate,  accelerations:  Present,  decelerations:  Absent UC:   regular, every 2-4 minutes SVE:   Dilation: 2 Effacement (%): 50 Station: -3 Exam by:: H. J. Heinzenee Kendrick rn  Labs: Lab Results  Component Value Date   WBC 9.8 07/07/2013   HGB 11.3* 07/07/2013   HCT 35.1* 07/07/2013   MCV 82.6 07/07/2013   PLT 268 07/07/2013    Assessment / Plan: Induction of labor due to postdates,  progressing well on pitocin  Labor: Latent phase Preeclampsia:  n/a Fetal Wellbeing:  Category I Pain Control:  Epidural I/D:  n/a Anticipated MOD:  NSVD  Anna Neal 07/08/2013, 6:48 PM

## 2013-07-09 ENCOUNTER — Inpatient Hospital Stay (HOSPITAL_COMMUNITY): Payer: Medicaid Other | Admitting: Anesthesiology

## 2013-07-09 ENCOUNTER — Encounter (HOSPITAL_COMMUNITY)
Admission: RE | Disposition: A | Discharge status: Home / Self Care | Payer: Self-pay | Source: Ambulatory Visit | Attending: Obstetrics & Gynecology

## 2013-07-09 ENCOUNTER — Encounter (HOSPITAL_COMMUNITY): Payer: Medicaid Other | Admitting: Anesthesiology

## 2013-07-09 DIAGNOSIS — N83209 Unspecified ovarian cyst, unspecified side: Secondary | ICD-10-CM

## 2013-07-09 LAB — CBC
HCT: 29.3 % — ABNORMAL LOW (ref 36.0–46.0)
Hemoglobin: 9.2 g/dL — ABNORMAL LOW (ref 12.0–15.0)
MCH: 25.9 pg — AB (ref 26.0–34.0)
MCHC: 31.4 g/dL (ref 30.0–36.0)
MCV: 82.5 fL (ref 78.0–100.0)
PLATELETS: 221 10*3/uL (ref 150–400)
RBC: 3.55 MIL/uL — ABNORMAL LOW (ref 3.87–5.11)
RDW: 16.6 % — ABNORMAL HIGH (ref 11.5–15.5)
WBC: 15.2 10*3/uL — ABNORMAL HIGH (ref 4.0–10.5)

## 2013-07-09 LAB — BASIC METABOLIC PANEL
BUN: 4 mg/dL — ABNORMAL LOW (ref 6–23)
CHLORIDE: 101 meq/L (ref 96–112)
CO2: 22 mEq/L (ref 19–32)
Calcium: 8.2 mg/dL — ABNORMAL LOW (ref 8.4–10.5)
Creatinine, Ser: 0.5 mg/dL (ref 0.50–1.10)
GFR calc non Af Amer: >90 mL/min (ref >90)
Glucose, Bld: 155 mg/dL — ABNORMAL HIGH (ref 70–99)
Potassium: 4.4 mEq/L (ref 3.7–5.3)
Sodium: 134 mEq/L — ABNORMAL LOW (ref 137–147)

## 2013-07-09 SURGERY — Surgical Case
Anesthesia: General | Site: Abdomen

## 2013-07-09 MED ORDER — FENTANYL CITRATE 0.05 MG/ML IJ SOLN
INTRAMUSCULAR | Status: AC
  Filled 2013-07-09: qty 5

## 2013-07-09 MED ORDER — KETOROLAC TROMETHAMINE 30 MG/ML IJ SOLN
INTRAMUSCULAR | Status: AC
  Administered 2013-07-09: 30 mg via INTRAMUSCULAR
  Filled 2013-07-09: qty 1

## 2013-07-09 MED ORDER — SENNOSIDES-DOCUSATE SODIUM 8.6-50 MG PO TABS
2.0000 | ORAL_TABLET | ORAL | Status: DC
  Administered 2013-07-11 (×2): 2 via ORAL
  Filled 2013-07-09 (×4): qty 2

## 2013-07-09 MED ORDER — OXYTOCIN 10 UNIT/ML IJ SOLN
40.0000 [IU] | INTRAMUSCULAR | Status: DC | PRN
  Administered 2013-07-09: 40 [IU] via INTRAVENOUS

## 2013-07-09 MED ORDER — SIMETHICONE 80 MG PO CHEW
80.0000 mg | CHEWABLE_TABLET | ORAL | Status: DC | PRN
  Administered 2013-07-10: 80 mg via ORAL
  Filled 2013-07-09 (×2): qty 1

## 2013-07-09 MED ORDER — EPHEDRINE 5 MG/ML INJ: 10.0000 mg | INTRAVENOUS | Status: DC | PRN

## 2013-07-09 MED ORDER — ONDANSETRON HCL 4 MG/2ML IJ SOLN: 4.0000 mg | INTRAMUSCULAR | Status: DC | PRN

## 2013-07-09 MED ORDER — HYDROMORPHONE HCL PF 1 MG/ML IJ SOLN: 0.2500 mg | INTRAMUSCULAR | Status: DC | PRN

## 2013-07-09 MED ORDER — PRENATAL MULTIVITAMIN CH
1.0000 | ORAL_TABLET | Freq: Every day | ORAL | Status: DC
  Administered 2013-07-10 – 2013-07-12 (×3): 1 via ORAL
  Filled 2013-07-09 (×4): qty 1

## 2013-07-09 MED ORDER — MEPERIDINE HCL 25 MG/ML IJ SOLN: 6.2500 mg | INTRAMUSCULAR | Status: DC | PRN

## 2013-07-09 MED ORDER — DIPHENHYDRAMINE HCL 25 MG PO CAPS
25.0000 mg | ORAL_CAPSULE | Freq: Four times a day (QID) | ORAL | Status: DC | PRN
  Filled 2013-07-09: qty 1

## 2013-07-09 MED ORDER — FENTANYL 2.5 MCG/ML BUPIVACAINE 1/10 % EPIDURAL INFUSION (WH - ANES): 14.0000 mL/h | INTRAMUSCULAR | Status: DC | PRN

## 2013-07-09 MED ORDER — OXYCODONE-ACETAMINOPHEN 5-325 MG PO TABS: 1.0000 | ORAL_TABLET | ORAL | Status: DC | PRN

## 2013-07-09 MED ORDER — KETOROLAC TROMETHAMINE 30 MG/ML IJ SOLN: 30.0000 mg | Freq: Four times a day (QID) | INTRAMUSCULAR | Status: DC | PRN

## 2013-07-09 MED ORDER — SODIUM CHLORIDE 0.9 % IJ SOLN: 3.0000 mL | INTRAMUSCULAR | Status: DC | PRN

## 2013-07-09 MED ORDER — MAGNESIUM HYDROXIDE 400 MG/5ML PO SUSP
30.0000 mL | ORAL | Status: DC | PRN
  Filled 2013-07-09: qty 30

## 2013-07-09 MED ORDER — OXYTOCIN 10 UNIT/ML IJ SOLN
INTRAMUSCULAR | Status: AC
  Filled 2013-07-09: qty 4

## 2013-07-09 MED ORDER — NALOXONE HCL 0.4 MG/ML IJ SOLN: 0.4000 mg | INTRAMUSCULAR | Status: DC | PRN

## 2013-07-09 MED ORDER — TETANUS-DIPHTH-ACELL PERTUSSIS 5-2.5-18.5 LF-MCG/0.5 IM SUSP
0.5000 mL | Freq: Once | INTRAMUSCULAR | Status: AC
  Administered 2013-07-10: 0.5 mL via INTRAMUSCULAR
  Filled 2013-07-09 (×2): qty 0.5

## 2013-07-09 MED ORDER — FENTANYL CITRATE 0.05 MG/ML IJ SOLN
INTRAMUSCULAR | Status: AC
  Filled 2013-07-09: qty 2

## 2013-07-09 MED ORDER — LANOLIN HYDROUS EX OINT
1.0000 | TOPICAL_OINTMENT | CUTANEOUS | Status: DC | PRN
Start: 2013-07-09 — End: 2013-07-12

## 2013-07-09 MED ORDER — MEASLES, MUMPS & RUBELLA VAC ~~LOC~~ INJ
0.5000 mL | INJECTION | Freq: Once | SUBCUTANEOUS | Status: DC
  Filled 2013-07-09: qty 0.5

## 2013-07-09 MED ORDER — DIPHENHYDRAMINE HCL 50 MG/ML IJ SOLN: 12.5000 mg | INTRAMUSCULAR | Status: DC | PRN

## 2013-07-09 MED ORDER — SUCCINYLCHOLINE CHLORIDE 20 MG/ML IJ SOLN
INTRAMUSCULAR | Status: AC
  Filled 2013-07-09: qty 10

## 2013-07-09 MED ORDER — PROPOFOL 10 MG/ML IV BOLUS
INTRAVENOUS | Status: DC | PRN
  Administered 2013-07-09: 200 mg via INTRAVENOUS

## 2013-07-09 MED ORDER — IBUPROFEN 600 MG PO TABS
600.0000 mg | ORAL_TABLET | Freq: Four times a day (QID) | ORAL | Status: DC
  Administered 2013-07-10 – 2013-07-12 (×10): 600 mg via ORAL
  Filled 2013-07-09 (×10): qty 1

## 2013-07-09 MED ORDER — LACTATED RINGERS IV SOLN: 500.0000 mL | Freq: Once | INTRAVENOUS | Status: DC

## 2013-07-09 MED ORDER — NALBUPHINE HCL 10 MG/ML IJ SOLN: 5.0000 mg | INTRAMUSCULAR | Status: DC | PRN

## 2013-07-09 MED ORDER — METOCLOPRAMIDE HCL 5 MG/ML IJ SOLN: 10.0000 mg | Freq: Three times a day (TID) | INTRAMUSCULAR | Status: DC | PRN

## 2013-07-09 MED ORDER — KETOROLAC TROMETHAMINE 30 MG/ML IJ SOLN
30.0000 mg | Freq: Four times a day (QID) | INTRAMUSCULAR | Status: DC | PRN
  Administered 2013-07-09: 30 mg via INTRAMUSCULAR

## 2013-07-09 MED ORDER — HYDROMORPHONE HCL PF 1 MG/ML IJ SOLN
INTRAMUSCULAR | Status: DC | PRN
  Administered 2013-07-09: 1 mg via INTRAVENOUS

## 2013-07-09 MED ORDER — DIBUCAINE 1 % RE OINT
1.0000 "application " | TOPICAL_OINTMENT | RECTAL | Status: DC | PRN
  Filled 2013-07-09: qty 28

## 2013-07-09 MED ORDER — ONDANSETRON HCL 4 MG/2ML IJ SOLN
INTRAMUSCULAR | Status: AC
  Filled 2013-07-09: qty 2

## 2013-07-09 MED ORDER — LIDOCAINE HCL (CARDIAC) 20 MG/ML IV SOLN
INTRAVENOUS | Status: AC
  Filled 2013-07-09: qty 5

## 2013-07-09 MED ORDER — MIDAZOLAM HCL 2 MG/2ML IJ SOLN
INTRAMUSCULAR | Status: DC | PRN
  Administered 2013-07-09: 2 mg via INTRAVENOUS

## 2013-07-09 MED ORDER — LACTATED RINGERS IV SOLN
INTRAVENOUS | Status: DC | PRN
  Administered 2013-07-09: 20:00:00 via INTRAVENOUS

## 2013-07-09 MED ORDER — DIPHENHYDRAMINE HCL 50 MG/ML IJ SOLN: 25.0000 mg | INTRAMUSCULAR | Status: DC | PRN

## 2013-07-09 MED ORDER — PHENYLEPHRINE 40 MCG/ML (10ML) SYRINGE FOR IV PUSH (FOR BLOOD PRESSURE SUPPORT): 80.0000 ug | PREFILLED_SYRINGE | INTRAVENOUS | Status: DC | PRN

## 2013-07-09 MED ORDER — ONDANSETRON HCL 4 MG PO TABS: 4.0000 mg | ORAL_TABLET | ORAL | Status: DC | PRN

## 2013-07-09 MED ORDER — DEXAMETHASONE SODIUM PHOSPHATE 10 MG/ML IJ SOLN
INTRAMUSCULAR | Status: DC | PRN
  Administered 2013-07-09: 10 mg via INTRAVENOUS

## 2013-07-09 MED ORDER — FERROUS SULFATE 325 (65 FE) MG PO TABS
325.0000 mg | ORAL_TABLET | Freq: Two times a day (BID) | ORAL | Status: DC
  Administered 2013-07-10 – 2013-07-12 (×5): 325 mg via ORAL
  Filled 2013-07-09 (×7): qty 1

## 2013-07-09 MED ORDER — AZITHROMYCIN 500 MG IV SOLR
500.0000 mg | Freq: Once | INTRAVENOUS | Status: AC
  Administered 2013-07-09: 500 mg via INTRAVENOUS
  Filled 2013-07-09: qty 500

## 2013-07-09 MED ORDER — ONDANSETRON HCL 4 MG/2ML IJ SOLN
INTRAMUSCULAR | Status: DC | PRN
  Administered 2013-07-09: 4 mg via INTRAVENOUS

## 2013-07-09 MED ORDER — SIMETHICONE 80 MG PO CHEW
80.0000 mg | CHEWABLE_TABLET | ORAL | Status: DC
  Administered 2013-07-11 (×2): 80 mg via ORAL
  Filled 2013-07-09 (×4): qty 1

## 2013-07-09 MED ORDER — PROPOFOL 10 MG/ML IV EMUL
INTRAVENOUS | Status: AC
  Filled 2013-07-09: qty 20

## 2013-07-09 MED ORDER — PROMETHAZINE HCL 25 MG/ML IJ SOLN: 6.2500 mg | INTRAMUSCULAR | Status: DC | PRN

## 2013-07-09 MED ORDER — SCOPOLAMINE 1 MG/3DAYS TD PT72: 1.0000 | MEDICATED_PATCH | Freq: Once | TRANSDERMAL | Status: DC

## 2013-07-09 MED ORDER — DIPHENHYDRAMINE HCL 25 MG PO CAPS
25.0000 mg | ORAL_CAPSULE | ORAL | Status: DC | PRN
  Filled 2013-07-09: qty 1

## 2013-07-09 MED ORDER — ZOLPIDEM TARTRATE 5 MG PO TABS: 5.0000 mg | ORAL_TABLET | Freq: Every evening | ORAL | Status: DC | PRN

## 2013-07-09 MED ORDER — NALOXONE HCL 1 MG/ML IJ SOLN: 1.0000 ug/kg/h | INTRAVENOUS | Status: DC | PRN

## 2013-07-09 MED ORDER — OXYTOCIN 40 UNITS IN LACTATED RINGERS INFUSION - SIMPLE MED: 62.5000 mL/h | INTRAVENOUS | Status: AC

## 2013-07-09 MED ORDER — ONDANSETRON HCL 4 MG/2ML IJ SOLN: 4.0000 mg | Freq: Three times a day (TID) | INTRAMUSCULAR | Status: DC | PRN

## 2013-07-09 MED ORDER — MIDAZOLAM HCL 2 MG/2ML IJ SOLN
INTRAMUSCULAR | Status: AC
  Filled 2013-07-09: qty 2

## 2013-07-09 MED ORDER — LIDOCAINE HCL (CARDIAC) 20 MG/ML IV SOLN
INTRAVENOUS | Status: DC | PRN
  Administered 2013-07-09: 50 mg via INTRAVENOUS

## 2013-07-09 MED ORDER — HYDROMORPHONE HCL PF 1 MG/ML IJ SOLN
INTRAMUSCULAR | Status: AC
  Filled 2013-07-09: qty 1

## 2013-07-09 MED ORDER — WITCH HAZEL-GLYCERIN EX PADS: 1.0000 "application " | MEDICATED_PAD | CUTANEOUS | Status: DC | PRN

## 2013-07-09 MED ORDER — FENTANYL CITRATE 0.05 MG/ML IJ SOLN
INTRAMUSCULAR | Status: DC | PRN
  Administered 2013-07-09: 250 ug via INTRAVENOUS
  Administered 2013-07-09: 100 ug via INTRAVENOUS
  Administered 2013-07-09: 250 ug via INTRAVENOUS

## 2013-07-09 MED ORDER — FENTANYL CITRATE 0.05 MG/ML IJ SOLN: 25.0000 ug | INTRAMUSCULAR | Status: DC | PRN

## 2013-07-09 MED ORDER — CEFAZOLIN SODIUM-DEXTROSE 2-3 GM-% IV SOLR
INTRAVENOUS | Status: DC | PRN
  Administered 2013-07-09: 2 g via INTRAVENOUS

## 2013-07-09 MED ORDER — SUCCINYLCHOLINE CHLORIDE 20 MG/ML IJ SOLN
INTRAMUSCULAR | Status: DC | PRN
Start: 2013-07-09 — End: 2013-07-09
  Administered 2013-07-09: 120 mg via INTRAVENOUS

## 2013-07-09 MED ORDER — SCOPOLAMINE 1 MG/3DAYS TD PT72
1.0000 | MEDICATED_PATCH | Freq: Once | TRANSDERMAL | Status: DC
  Administered 2013-07-09: 1.5 mg via TRANSDERMAL

## 2013-07-09 MED ORDER — DEXAMETHASONE SODIUM PHOSPHATE 10 MG/ML IJ SOLN
INTRAMUSCULAR | Status: AC
  Filled 2013-07-09: qty 1

## 2013-07-09 MED ORDER — LACTATED RINGERS IV SOLN
INTRAVENOUS | Status: DC
  Administered 2013-07-10 (×2): via INTRAVENOUS

## 2013-07-09 MED ORDER — SCOPOLAMINE 1 MG/3DAYS TD PT72
MEDICATED_PATCH | TRANSDERMAL | Status: AC
  Filled 2013-07-09: qty 1

## 2013-07-09 MED ORDER — NALOXONE HCL 1 MG/ML IJ SOLN
1.0000 ug/kg/h | INTRAVENOUS | Status: DC | PRN
  Filled 2013-07-09: qty 2

## 2013-07-09 SURGICAL SUPPLY — 56 items
ADH SKN CLS APL DERMABOND .7 (GAUZE/BANDAGES/DRESSINGS) ×1
APL SKNCLS STERI-STRIP NONHPOA (GAUZE/BANDAGES/DRESSINGS) ×2
BENZOIN TINCTURE PRP APPL 2/3 (GAUZE/BANDAGES/DRESSINGS) ×4 IMPLANT
CANISTER WOUND CARE 500ML ATS (WOUND CARE) IMPLANT
CLAMP CORD UMBIL (MISCELLANEOUS) ×2 IMPLANT
CLOSURE WOUND 1/2 X4 (GAUZE/BANDAGES/DRESSINGS) ×1
CLOTH BEACON ORANGE TIMEOUT ST (SAFETY) ×3 IMPLANT
CONTAINER PREFILL 10% NBF 15ML (MISCELLANEOUS) ×6 IMPLANT
DERMABOND ADVANCED (GAUZE/BANDAGES/DRESSINGS) ×2
DERMABOND ADVANCED .7 DNX12 (GAUZE/BANDAGES/DRESSINGS) ×1 IMPLANT
DRAPE LG THREE QUARTER DISP (DRAPES) ×2 IMPLANT
DRESSING DISP NPWT PICO 4X12 (MISCELLANEOUS) ×2 IMPLANT
DRSG OPSITE POSTOP 4X10 (GAUZE/BANDAGES/DRESSINGS) ×1 IMPLANT
DRSG VAC ATS LRG SENSATRAC (GAUZE/BANDAGES/DRESSINGS) IMPLANT
DRSG VAC ATS MED SENSATRAC (GAUZE/BANDAGES/DRESSINGS) IMPLANT
DRSG VAC ATS SM SENSATRAC (GAUZE/BANDAGES/DRESSINGS) IMPLANT
DURAPREP 26ML APPLICATOR (WOUND CARE) ×3 IMPLANT
ELECT REM PT RETURN 9FT ADLT (ELECTROSURGICAL) ×3
ELECTRODE REM PT RTRN 9FT ADLT (ELECTROSURGICAL) ×1 IMPLANT
EXTRACTOR VACUUM M CUP 4 TUBE (SUCTIONS) IMPLANT
EXTRACTOR VACUUM M CUP 4' TUBE (SUCTIONS)
GLOVE BIO SURGEON STRL SZ8 (GLOVE) ×6 IMPLANT
GOWN STRL REUS W/TWL LRG LVL3 (GOWN DISPOSABLE) ×6 IMPLANT
KIT ABG SYR 3ML LUER SLIP (SYRINGE) IMPLANT
NDL HYPO 25X5/8 SAFETYGLIDE (NEEDLE) ×1 IMPLANT
NEEDLE HYPO 25X5/8 SAFETYGLIDE (NEEDLE) ×3 IMPLANT
NS IRRIG 1000ML POUR BTL (IV SOLUTION) ×3 IMPLANT
PACK C SECTION WH (CUSTOM PROCEDURE TRAY) ×3 IMPLANT
PAD OB MATERNITY 4.3X12.25 (PERSONAL CARE ITEMS) ×3 IMPLANT
RTRCTR C-SECT PINK 25CM LRG (MISCELLANEOUS) ×1 IMPLANT
STAPLER VISISTAT 35W (STAPLE) IMPLANT
STRIP CLOSURE SKIN 1/2X4 (GAUZE/BANDAGES/DRESSINGS) ×1 IMPLANT
SUT GUT PLAIN 0 CT-3 TAN 27 (SUTURE) IMPLANT
SUT MNCRL 0 VIOLET CTX 36 (SUTURE) ×3 IMPLANT
SUT MNCRL AB 3-0 PS2 27 (SUTURE) ×4 IMPLANT
SUT MNCRL AB 4-0 PS2 18 (SUTURE) IMPLANT
SUT MON AB 2-0 CT1 27 (SUTURE) ×3 IMPLANT
SUT MON AB 2-0 SH 27 (SUTURE) ×15
SUT MON AB 2-0 SH27 (SUTURE) IMPLANT
SUT MON AB 3-0 SH 27 (SUTURE)
SUT MON AB 3-0 SH27 (SUTURE) IMPLANT
SUT MONOCRYL 0 CTX 36 (SUTURE) ×6
SUT PDS AB 0 CTX 36 PDP370T (SUTURE) ×2 IMPLANT
SUT PDS AB 0 CTX 60 (SUTURE) IMPLANT
SUT PLAIN 2 0 XLH (SUTURE) IMPLANT
SUT VIC AB 0 CTX 36 (SUTURE) ×6
SUT VIC AB 0 CTX36XBRD ANBCTRL (SUTURE) IMPLANT
SUT VIC AB 2-0 CT1 (SUTURE) ×6 IMPLANT
SUT VIC AB 2-0 CT1 27 (SUTURE)
SUT VIC AB 2-0 CT1 TAPERPNT 27 (SUTURE) IMPLANT
TOWEL OR 17X24 6PK STRL BLUE (TOWEL DISPOSABLE) ×3 IMPLANT
TRAY FOLEY CATH 14FR (SET/KITS/TRAYS/PACK) ×3 IMPLANT
TUBING NON-CON 1/4 X 20 CONN (TUBING) ×1 IMPLANT
TUBING NON-CON 1/4 X 20' CONN (TUBING) ×1
WATER STERILE IRR 1000ML POUR (IV SOLUTION) ×3 IMPLANT
YANKAUER SUCT BULB TIP NO VENT (SUCTIONS) ×2 IMPLANT

## 2013-07-09 NOTE — Consult Note (Signed)
[facility]   Delivery Note: C-section 07/09/2013 7:38 PM   I was called to the operating room at the request of the patient's obstetrician (Dr. Jackson-Moore) for a a stat c-section for a prolapsed cord.   PRENATAL HX: 32 y/o G1P0 at 41 and 1/[redacted] weeks gestation, admitted to OB service for IOL on 07/08/13. A+, GBS negative. 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   INTRAPARTUM HX: IOL since yesterday morning, slow to progress. AROM with prolpased cord at the time of rupture. Mother taken for stat c-section under general anesthesia. Fetal heart rate was 120s in OR just prior to delivery.   DELIVERY: Infant had low tone and poor respiratory effort at delivery, but responded quickly to standard warming, drying and stimulation. APGARs 7 and 9. Exam within normal limits. After 5 minutes, baby left with nurse to assist parents with skin-to-skin care.   _____________________  Electronically Signed By:  Muriel Hannold, MD  Neonatologist  

## 2013-07-09 NOTE — Transfer of Care (Signed)
Immediate Anesthesia Transfer of Care Note  Patient: Anna Neal  Procedure(s) Performed: Procedure(s): CESAREAN SECTION (N/A)  Patient Location: PACU  Anesthesia Type:General  Level of Consciousness: awake, alert , oriented, patient cooperative and responds to stimulation  Airway & Oxygen Therapy: Patient Spontanous Breathing and Patient connected to nasal cannula oxygen  Post-op Assessment: Report given to PACU RN, Post -op Vital signs reviewed and stable and Patient moving all extremities  Post vital signs: Reviewed and stable  Complications: No apparent anesthesia complications

## 2013-07-09 NOTE — Op Note (Addendum)
Cesarean Section Procedure Note   Anna Neal   07/07/2013 - 07/09/2013  Indications: Cord prolapse   Pre-operative Diagnosis: Cesarean Section for Prolapsed cord.   Post-operative Diagnosis: Same, 4 cm right ovarian cyst  Surgeon: Antionette CharLisa Jackson-Moore  Assistants: none  Anesthesia: general  Procedure Details:   The patient was identified as [name] A Lavallie and the procedure verified as C-Section Delivery. A Time Out was held and the above information confirmed.  After induction of anesthesia, the patient was draped and prepped in the usual sterile manner. A transverse incision was made and carried down through the subcutaneous tissue to the fascia. The fascial incision was made and extended transversely. The rectus muscle was separated in the midline.  The peritoneum was identified and entered. The peritoneal incision was extended bluntly. There was no lower uterine segment development.  A vertical  uterine incision was made. Delivered from a breech presentation was a living newborn infant. APGAR (1 MIN): 7   APGAR (5 MINS): 9       A cord ph was sent however the specimen was clotted. The umbilical cord was clamped and cut cord. A sample was obtained for evaluation. The placenta was removed Intact and appeared normal.  The uterus was exteriorized.  The uterine incision was closed in layers.  An initial layer was closed with running locked sutures of 1-0 Monocryl. A second layer of running horizontal mattress of the same suture was placed.  Bleeding points in the serosa were secured with figure-of-eight sutures of 2-0 Vicryl.  Hemostasis was observed. A  4 cm right ovarian cyst with a smooth wall noted.  The ovarian cortex overlying the cyst was incised.  The cyst was dissected from the ovarian cortex using sharp and blunt dissection.  During the dissection, the cyst was ruptured.  The cyst contents was serous fluid. Bleeding points in the cyst bed were clamped and sutured with 2-0  Monocryl.  Full thickness figure of eight were placed to plicate the cortex proximal to the infundibulopelvic ligament and for hemostasis.  The remainder of the cortex was closed with a running, locked suture of the same.  Adequate hemostasis was observed.   The paracolic gutters were irrigated. The uterus was replaced in the abdomen.  The parieto peritoneum was closed in a running fashion with 2-0 Vicryl.  The fascia was then reapproximated with running sutures of 0 Vicryl.  The skin was closed with suture.  Instrument, sponge, and needle counts were correct prior the abdominal closure and were correct at the conclusion of the case. A negative pressure dressing was applied.   Findings:  See above   Estimated Blood Loss: 1 L  Total IV Fluids: per Anesthesiology  Urine Output: per Anesthesiology  Specimens: Placenta, ovarian cyst wall  Complications: None  Disposition: PACU - hemodynamically stable.  Maternal Condition: stable   Baby condition / location:  Nursery    Signed: Surgeon(s):  Antionette CharLisa Jackson-Moore, MD

## 2013-07-09 NOTE — Anesthesia Preprocedure Evaluation (Signed)
Anesthesia Evaluation  Patient identified by MRN, date of birth, ID band Patient awake    Reviewed: Allergy & Precautions, H&P , Patient's Chart, lab work & pertinent test results, reviewed documented beta blocker date and time   Airway Mallampati: III TM Distance: >3 FB Neck ROM: full    Dental no notable dental hx.    Pulmonary  breath sounds clear to auscultation  Pulmonary exam normal       Cardiovascular hypertension, Rhythm:regular Rate:Normal     Neuro/Psych    GI/Hepatic   Endo/Other  Morbid obesity  Renal/GU      Musculoskeletal   Abdominal   Peds  Hematology   Anesthesia Other Findings   Reproductive/Obstetrics                           Anesthesia Physical Anesthesia Plan  ASA: III and emergent  Anesthesia Plan: General   Post-op Pain Management:    Induction: Intravenous, Rapid sequence and Cricoid pressure planned  Airway Management Planned: Oral ETT  Additional Equipment:   Intra-op Plan:   Post-operative Plan: Extubation in OR  Informed Consent: I have reviewed the patients History and Physical, chart, labs and discussed the procedure including the risks, benefits and alternatives for the proposed anesthesia with the patient or authorized representative who has indicated his/her understanding and acceptance.   Dental Advisory Given and Dental advisory given  Plan Discussed with: CRNA and Surgeon  Anesthesia Plan Comments: (  Discussed general anesthesia, as we sprinted to OR 9. Denies allergies or medical problems. )        Anesthesia Quick Evaluation

## 2013-07-09 NOTE — Anesthesia Procedure Notes (Signed)
Procedure Name: Intubation Date/Time: 07/09/2013 7:30 PM Performed by: Marrion CoyMERRITT, Collie Wernick R Pre-anesthesia Checklist: Patient identified, Patient being monitored, Timeout performed, Emergency Drugs available and Suction available Patient Re-evaluated:Patient Re-evaluated prior to inductionOxygen Delivery Method: Circle system utilized Preoxygenation: Pre-oxygenation with 100% oxygen Intubation Type: IV induction, Rapid sequence and Cricoid Pressure applied Grade View: Grade II Airway Equipment and Method: Video-laryngoscopy Placement Confirmation: ETT inserted through vocal cords under direct vision,  breath sounds checked- equal and bilateral and positive ETCO2 Secured at: 21 cm Tube secured with: Tape Dental Injury: Teeth and Oropharynx as per pre-operative assessment

## 2013-07-09 NOTE — Progress Notes (Signed)
Anna Neal is a 33 y.o. G3P2002 at 2028w1d by LMP admitted for induction of labor due to Post dates. Due date 07-02-13.  Subjective:   Objective: BP 136/87  Pulse 81  Temp(Src) 98 F (36.7 C) (Oral)  Resp 20  Ht 5\' 2"  (1.575 m)  Wt 241 lb (109.317 kg)  BMI 44.07 kg/m2  SpO2 98%  LMP 09/24/2012      FHT:  FHR: 140-150 bpm, variability: moderate,  accelerations:  Present,  decelerations:  Absent UC:   Irregular SVE:   Dilation: 2 Effacement (%): 50 Station: -3 Exam by:: G. Whitfield,RN  Labs: Lab Results  Component Value Date   WBC 9.8 07/07/2013   HGB 11.3* 07/07/2013   HCT 35.1* 07/07/2013   MCV 82.6 07/07/2013   PLT 268 07/07/2013    Assessment / Plan: 41 weeks.  2 stage IOL.  No progress with pitocin.  Foley bulb placed for cervical ripening.  Labor: No progress on pitocin. Preeclampsia:  n/a Fetal Wellbeing:  Category I Pain Control:  Labor support without medications I/D:  n/a Anticipated MOD:  NSVD  Anna Neal 07/09/2013, 3:03 AM

## 2013-07-09 NOTE — Progress Notes (Signed)
Pt sleepy but requesting to visit with baby, baby and father of baby in to see patient, placed baby skin to skin, pt too sleepy to attempt breastfeeding at present, back to sleep within moments of placing baby on chest, will continue to monitor

## 2013-07-09 NOTE — Progress Notes (Signed)
Anna Neal is a 33 y.o. G3P2002 at 3556w1d by LMP admitted for induction of labor due to Post dates. Due date 07-02-13.  Subjective: Comfortable  Objective: BP 114/77  Pulse 86  Temp(Src) 98.3 F (36.8 C) (Axillary)  Resp 21  Ht 5\' 2"  (1.575 m)  Wt 109.317 kg (241 lb)  BMI 44.07 kg/m2  SpO2 100%  LMP 09/24/2012   Total I/O In: 2500 [I.V.:2500] Out: 1800 [Urine:300; Blood:1500]  FHT:  FHR: 140 bpm, variability: moderate,  accelerations:  Present,  decelerations:  Present late decelerations on Pitocin UC:   Irregular SVE:   Dilation: 3 - 4 Effacement (%): 60 Station: -3 Exam by:: Jean Rosenthaljackson moore (jackson moore) AROM-->copious, clear fluid-->cord prolapse-->head elevation off cord Labs: Lab Results  Component Value Date   WBC 9.8 07/07/2013   HGB 11.3* 07/07/2013   HCT 35.1* 07/07/2013   MCV 82.6 07/07/2013   PLT 268 07/07/2013    Assessment / Plan: 41 weeks. Cord prolapse  To OR for STAT C/D  Antionette CharLisa Jackson-Moore 07/09/2013, 10:15 PM

## 2013-07-10 ENCOUNTER — Encounter (HOSPITAL_COMMUNITY): Payer: Self-pay

## 2013-07-10 ENCOUNTER — Encounter: Payer: Medicaid Other | Admitting: Obstetrics

## 2013-07-10 LAB — CBC
HCT: 27.4 % — ABNORMAL LOW (ref 36.0–46.0)
Hemoglobin: 8.8 g/dL — ABNORMAL LOW (ref 12.0–15.0)
MCH: 26.7 pg (ref 26.0–34.0)
MCHC: 32.1 g/dL (ref 30.0–36.0)
MCV: 83.3 fL (ref 78.0–100.0)
Platelets: 237 10*3/uL (ref 150–400)
RBC: 3.29 MIL/uL — ABNORMAL LOW (ref 3.87–5.11)
RDW: 16.6 % — ABNORMAL HIGH (ref 11.5–15.5)
WBC: 17.7 10*3/uL — AB (ref 4.0–10.5)

## 2013-07-10 NOTE — Anesthesia Postprocedure Evaluation (Signed)
  Anesthesia Post-op Note  Patient: Anna Neal  Procedure(s) Performed: Procedure(s): CESAREAN SECTION (N/A)  Patient Location: PACU  Anesthesia Type:General  Level of Consciousness: awake and alert   Airway and Oxygen Therapy: Patient Spontanous Breathing  Post-op Pain: none  Post-op Assessment: Post-op Vital signs reviewed, Patient's Cardiovascular Status Stable, Respiratory Function Stable, Patent Airway and Pain level controlled  Post-op Vital Signs: Reviewed and stable  Last Vitals:  Filed Vitals:   07/10/13 0230  BP: 120/80  Pulse: 65  Temp: 36.8 C  Resp: 18    Complications: No apparent anesthesia complications

## 2013-07-10 NOTE — Lactation Note (Signed)
This note was copied from the chart of Anna Nehemie Magnone. Lactation Consultation Note Experienced BF mom. Has 7 yr. Old child, BF her for 3 months, has 2 yr. Old son BF him for 8 months until he developed teeth and started biting, so she stopped. Stated that she wasn't having any trouble BF at this time. Mom encouraged to feed baby 8-12 times/24 hours and with feeding cues. Reviewed Baby & Me book's Breastfeeding Basics. Encouraged to call for assistance if needed and to verify proper latch. Referred to Baby and Me Book in Breastfeeding section Pg. 22-23 for position options and Proper latch demonstration.Mom encouraged to feed baby w/feeding cues. Hand expression taught to Mom. Mom made aware of O/P services, breastfeeding support groups, community resources, and our phone # for post-discharge questions. WH/LC brochure given w/resources, support groups and LC services. Patient Name: Anna Neal Today's Date: 07/10/2013     Maternal Data    Feeding Feeding Type: Breast Fed Length of feed: 30 min  LATCH Score/Interventions Latch: Grasps breast easily, tongue down, lips flanged, rhythmical sucking.  Audible Swallowing: None  Type of Nipple: Everted at rest and after stimulation  Comfort (Breast/Nipple): Soft / non-tender     Hold (Positioning): Assistance needed to correctly position infant at breast and maintain latch.  LATCH Score: 7  Lactation Tools Discussed/Used     Consult Status      Charyl DancerLaura G Kloie Neal 07/10/2013, 1:24 AM

## 2013-07-10 NOTE — Lactation Note (Signed)
This note was copied from the chart of Anna Neal. Lactation Consultation Note  Patient Name: Anna Neal Reason for consult: Follow-up assessment Follow-up assessment, baby 18 hours of life. Baby sleepy, would not wake up. Mom demonstrated proper waking techniques. Mom has experience breastfeeding her 2 older children. Enc mom to feed with cues and to offer lots of STS. Mom aware of OP/BFSG services. Enc mom to call out for assistance as needed.   Maternal Data    Feeding Feeding Type:  (Baby would not wake to latch. ) Length of feed: 0 min  LATCH Score/Interventions                      Lactation Tools Discussed/Used     Consult Status Consult Status: Follow-up Follow-up type: In-patient    Sherlyn HayJennifer D Jarely Juncaj Neal, 2:22 PM

## 2013-07-10 NOTE — Progress Notes (Signed)
UR completed 

## 2013-07-11 NOTE — Lactation Note (Signed)
This note was copied from the chart of Girl Suman Gohr. Lactation Consultation Note  Patient Name: Girl Mitzie NaLadallas Kuehnel RUEAV'WToday's Date: 07/11/2013 Reason for consult: Follow-up assessment Baby 40 hours of life. Mom reports that she has given a bottle because she wanted to sleep. Discussed risks of offering bottle when trying to exclusively BF. Mom states she understands, she has breastfed her older 2 boys. Reviewed breast engorgement/treatment. Enc mom to feed with cues. Mom's 2 older boys are in the room and have been with mom and baby overnight. Mom states that she is very tired, but intends to breastfeed baby. Enc mom to call out at next feeding and LC will assist with latch. Mom aware of OP/BFSG and community services. Mom enc to call for assistance as needed.   Maternal Data Infant to breast within first hour of birth: Yes Has patient been taught Hand Expression?: Yes Does the patient have breastfeeding experience prior to this delivery?: Yes  Feeding Feeding Type:  (Mom gave bottle a little earlier, wanting to sleep.) Nipple Type: Regular  LATCH Score/Interventions                      Lactation Tools Discussed/Used     Consult Status Consult Status: Follow-up Follow-up type: In-patient    Sherlyn HayJennifer D Silena Wyss 07/11/2013, 1:31 PM

## 2013-07-11 NOTE — Progress Notes (Signed)
Subjective: Postpartum Day 2: Cesarean Delivery Patient reports tolerating PO, + flatus and no problems voiding.    Objective: Vital signs in last 24 hours: Temp:  [98 F (36.7 C)-99 F (37.2 C)] 99 F (37.2 C) (05/15 0558) Pulse Rate:  [80-91] 91 (05/15 0558) Resp:  [18-20] 18 (05/15 0558) BP: (89-117)/(51-71) 117/71 mmHg (05/15 0558) SpO2:  [98 %-100 %] 98 % (05/14 2100)  Physical Exam:  General: alert and cooperative Lochia: appropriate Uterine Fundus: firm Incision: healing well, no significant drainage, wound vac in place DVT Evaluation: No evidence of DVT seen on physical exam.   Recent Labs  07/09/13 2315 07/10/13 0623  HGB 9.2* 8.8*  HCT 29.3* 27.4*    Assessment/Plan: Status post Cesarean section. Doing well postoperatively.  Continue current care. Reviewed PP education.  Auryn Paige Dessa PhiHowell Cecilie Heidel 07/11/2013, 9:28 AM

## 2013-07-12 NOTE — Progress Notes (Signed)
Patient ID: Anna ArcherLadallas A Lobban, female   DOB: 12/25/1980, 33 y.o.   MRN: [id number] And postop day 3 Bile signs normal Abdomen soft A dressing dry drain functioning Legs negative home today see Dr.  Clearance Cootsharper in 2 days to remove the drain

## 2013-07-12 NOTE — Discharge Summary (Signed)
Obstetric Discharge Summary Reason for Admission: induction of labor Prenatal Procedures: none Intrapartum Procedures: cesarean: low cervical, transverse Postpartum Procedures: none Complications-Operative and Postpartum: none Hemoglobin  Date Value Ref Range Status  07/10/2013 8.8* 12.0 - 15.0 g/dL Final     HCT  Date Value Ref Range Status  07/10/2013 27.4* 36.0 - 46.0 % Final    Physical Exam:  General: alert Lochia: appropriate Uterine Fundus: firm Incision: healing well DVT Evaluation: No evidence of DVT seen on physical exam.  Discharge Diagnoses: Term Pregnancy-delivered  Discharge Information: Date: 07/12/2013 Activity: pelvic rest Diet: routine Medications: Percocet Condition: stable Instructions: refer to practice specific booklet Discharge to: home Follow-up Information   Follow up with HARPER,CHARLES A, MD.   Specialty:  Obstetrics and Gynecology   Contact information:   [address] [place] [id number] [phone]       Newborn Data: Live born female  Birth Weight: 7 lb 3.5 oz (3275 g) APGAR: 7, 9  Home with mother.  Anna Neal 07/12/2013, 7:20 AM

## 2013-07-12 NOTE — Discharge Instructions (Signed)
Iron-Rich Diet  An iron-rich diet contains foods that are good sources of iron. Iron is an important mineral that helps your body produce hemoglobin. Hemoglobin is a protein in red blood cells that carries oxygen to the body's tissues. Sometimes, the iron level in your blood can be low. This may be caused by:  A lack of iron in your diet.  Blood loss.  Times of growth, such as during pregnancy or during a child's growth and development. Low levels of iron can cause a decrease in the number of red blood cells. This can result in iron deficiency anemia. Iron deficiency anemia symptoms include:  Tiredness.  Weakness.  Irritability.  Increased chance of infection. Here are some recommendations for daily iron intake:  Males older than 32 years of age need 8 mg of iron per day.  Women ages 10219 to 8250 need 18 mg of iron per day.  Pregnant women need 27 mg of iron per day, and women who are over 32 years of age and breastfeeding need 9 mg of iron per day.  Women over the age of 32 need 8 mg of iron per day. SOURCES OF IRON There are 2 types of iron that are found in food: heme iron and nonheme iron. Heme iron is absorbed by the body better than nonheme iron. Heme iron is found in meat, poultry, and fish. Nonheme iron is found in grains, beans, and vegetables. Heme Iron Sources Food / Iron (mg)  Chicken liver, 3 oz (85 g)/ 10 mg  Beef liver, 3 oz (85 g)/ 5.5 mg  Oysters, 3 oz (85 g)/ 8 mg  Beef, 3 oz (85 g)/ 2 to 3 mg  Shrimp, 3 oz (85 g)/ 2.8 mg  [place], 3 oz (85 g)/ 2 mg  Chicken, 3 oz (85 g) / 1 mg  Fish (tuna, halibut), 3 oz (85 g)/ 1 mg  Pork, 3 oz (85 g)/ 0.9 mg Nonheme Iron Sources Food / Iron (mg)  Ready-to-eat breakfast cereal, iron-fortified / 3.9 to 7 mg  Tofu,  cup / 3.4 mg  Kidney beans,  cup / 2.6 mg  Baked potato with skin / 2.7 mg  Asparagus,  cup / 2.2 mg  Avocado / 2 mg  Dried peaches,  cup / 1.6 mg  Raisins,  cup / 1.5 mg  Soy milk,  1 cup / 1.5 mg  Whole-wheat bread, 1 slice / 1.2 mg  Spinach, 1 cup / 0.8 mg  Broccoli,  cup / 0.6 mg IRON ABSORPTION Certain foods can decrease the body's absorption of iron. Try to avoid these foods and beverages while eating meals with iron-containing foods:  Coffee.  Tea.  Fiber.  Soy. Foods containing vitamin C can help increase the amount of iron your body absorbs from iron sources, especially from nonheme sources. Eat foods with vitamin C along with iron-containing foods to increase your iron absorption. Foods that are high in vitamin C include many fruits and vegetables. Some good sources are:  Fresh orange juice.  Oranges.  Strawberries.  Mangoes.  Grapefruit.  Red bell peppers.  Green bell peppers.  Broccoli.  Potatoes with skin.  Tomato juice. Document Released: 09/27/2004 Document Revised: 05/08/2011 Document Reviewed: 08/04/2010 [facility] Patient Information 2014 [place], [place]. Discharge instructions   You can wash your hair  Shower  Eat what you want  Drink what you want  See me in 6 weeks  Your ankles are going to swell more in the next 2 weeks than when pregnant  No sex for 6 weeks   Kathreen CosierBernard A Celia Gibbons, MD 07/12/2013

## 2013-07-15 ENCOUNTER — Ambulatory Visit (INDEPENDENT_AMBULATORY_CARE_PROVIDER_SITE_OTHER): Payer: Medicaid Other | Admitting: Obstetrics

## 2013-07-15 ENCOUNTER — Encounter: Payer: Self-pay | Admitting: Obstetrics

## 2013-07-15 DIAGNOSIS — R52 Pain, unspecified: Secondary | ICD-10-CM

## 2013-07-15 MED ORDER — IBUPROFEN 800 MG PO TABS: 800.0000 mg | ORAL_TABLET | Freq: Three times a day (TID) | ORAL | Status: DC | PRN

## 2013-07-15 NOTE — Progress Notes (Signed)
Subjective:     Anna Neal is a 33 y.o. female who presents for a postpartum visit. She is 1 week postpartum following a low cervical vertical Cesarean section. I have fully reviewed the prenatal and intrapartum course. The delivery was at 41 gestational weeks. Outcome: repeat cesarean section, low vertical incision. Anesthesia: spinal. Postpartum course has been normal. Baby's course has been normal. Baby is feeding by breast. Bleeding thin lochia. Bowel function is normal. Bladder function is normal. Patient is not sexually active. Contraception method is abstinence. Postpartum depression screening: negative.  The following portions of the patient's history were reviewed and updated as appropriate: allergies, current medications, past family history, past medical history, past social history, past surgical history and problem list.  Review of Systems A comprehensive review of systems was negative.   Objective:    BP 103/70  Pulse 102  Temp(Src) 98.7 F (37.1 C)  Ht 5\' 2"  (1.575 m)  Wt 231 lb (104.781 kg)  BMI 42.24 kg/m2  Breastfeeding? Yes  General:  alert and no distress PE:      Breasts:  Negative      Abdomen:  Soft, NT.  Wound Vac removed.                         Incision C, D, I.     Assessment:     Normal postpartum exam. Pap smear not done at today's visit.   Plan:    1. Contraception: abstinence 2. Ibuprofen Rx. 3. Follow up in: 1 week or as needed.

## 2013-07-23 ENCOUNTER — Ambulatory Visit (INDEPENDENT_AMBULATORY_CARE_PROVIDER_SITE_OTHER): Payer: Medicaid Other | Admitting: Obstetrics

## 2013-07-23 ENCOUNTER — Encounter: Payer: Self-pay | Admitting: Obstetrics

## 2013-07-23 DIAGNOSIS — Z3009 Encounter for other general counseling and advice on contraception: Secondary | ICD-10-CM

## 2013-07-23 NOTE — Progress Notes (Signed)
Subjective:     Anna Neal is a 33 y.o. female who presents for a postpartum visit. She is 2 weeks postpartum following a LTCS.  I have fully reviewed the prenatal and intrapartum course. The delivery was at 41 gestational weeks. Outcome: primary cesarean section, low transverse incision for cord prolapse. Anesthesia: General. Postpartum course has been normal. Baby's course has been normal. Baby is feeding by breast. Bleeding thin lochia. Bowel function is normal. Bladder function is normal. Patient is not sexually active. Contraception method is condoms. Postpartum depression screening: negative.  The following portions of the patient's history were reviewed and updated as appropriate: allergies, current medications, past family history, past medical history, past social history, past surgical history and problem list.  Review of Systems A comprehensive review of systems was negative.   Objective:    BP 131/87  Pulse 81  Temp(Src) 97.9 F (36.6 C)  Ht 5\' 2"  (1.575 m)  Wt 223 lb (101.152 kg)  BMI 40.78 kg/m2  Breastfeeding? Yes   General:  alert and no distress PE:  Deferred.  Consult only.    100% of 10 min visit spent on counseling and coordination of care. Assessment:    2 weeks postpartum.  Doing well.  Contraceptive counseling.  No hormonal contraception for religious reasons.  Considering ParaGuard IUD.  Plan:    1. Contraception: Unsure 2. Educational material on ParaGuard dispensed. 3. Follow up in: 4 weeks or as needed.

## 2013-08-21 ENCOUNTER — Encounter: Payer: Self-pay | Admitting: Obstetrics

## 2013-08-21 ENCOUNTER — Ambulatory Visit (INDEPENDENT_AMBULATORY_CARE_PROVIDER_SITE_OTHER): Payer: Medicaid Other | Admitting: Obstetrics

## 2013-08-21 VITALS — BP 111/72 | HR 72 | Temp 98.1°F | Wt 220.0 lb

## 2013-08-21 DIAGNOSIS — N898 Other specified noninflammatory disorders of vagina: Secondary | ICD-10-CM

## 2013-08-21 NOTE — Progress Notes (Signed)
Subjective:     Anna Neal is a 33 y.o. female who presents for a postpartum visit. She is 6 weeks postpartum following a low cervical transverse Cesarean section. I have fully reviewed the prenatal and intrapartum course. The delivery was at 41 gestational weeks. Outcome: primary cesarean section, low transverse incision. Anesthesia: epidural. Postpartum course has been normal. Baby's course has been normal. Baby is feeding by breast. Bleeding no bleeding. Bowel function is normal. Bladder function is normal. Patient is sexually active. Contraception method is none. Postpartum depression screening: negative.  The following portions of the patient's history were reviewed and updated as appropriate: allergies, current medications, past family history, past medical history, past social history, past surgical history and problem list.  Review of Systems A comprehensive review of systems was negative.   Objective:    BP 111/72  Pulse 72  Temp(Src) 98.1 F (36.7 C)  Wt 220 lb (99.791 kg)  LMP 08/16/2013  Breastfeeding? Yes  General:  alert and no distress Breasts:  Negative Abdomen:  Soft, nontender.  Incision C, D, I and nontender. Pelvic:  Uterus NSSC, nontender.              Adnexa negative        Assessment:     Normal postpartum exam. Pap smear not done at today's visit.   Plan:    1. Contraception: none 2. Continue PNV's. 3. Follow up in: 6 months or as needed.

## 2013-08-22 LAB — WET PREP BY MOLECULAR PROBE
CANDIDA SPECIES: NEGATIVE
Gardnerella vaginalis: POSITIVE — AB
Trichomonas vaginosis: NEGATIVE

## 2013-09-09 ENCOUNTER — Other Ambulatory Visit: Payer: Self-pay | Admitting: *Deleted

## 2013-09-09 DIAGNOSIS — B9689 Other specified bacterial agents as the cause of diseases classified elsewhere: Secondary | ICD-10-CM

## 2013-09-09 DIAGNOSIS — N760 Acute vaginitis: Principal | ICD-10-CM

## 2013-09-09 MED ORDER — TINIDAZOLE 500 MG PO TABS: 1.0000 g | ORAL_TABLET | Freq: Every day | ORAL | Status: DC

## 2013-12-29 ENCOUNTER — Encounter: Payer: Self-pay | Admitting: Obstetrics

## 2013-12-29 ENCOUNTER — Ambulatory Visit: Payer: Medicaid Other | Admitting: Obstetrics

## 2014-04-15 ENCOUNTER — Encounter (HOSPITAL_COMMUNITY): Payer: Self-pay

## 2014-04-15 ENCOUNTER — Inpatient Hospital Stay (HOSPITAL_COMMUNITY)
Admission: AD | Admit: 2014-04-15 | Discharge: 2014-04-15 | Disposition: A | Discharge status: Home / Self Care | Payer: Medicaid Other | Source: Ambulatory Visit | Attending: Obstetrics and Gynecology | Admitting: Obstetrics and Gynecology

## 2014-04-15 DIAGNOSIS — A599 Trichomoniasis, unspecified: Secondary | ICD-10-CM

## 2014-04-15 DIAGNOSIS — R42 Dizziness and giddiness: Secondary | ICD-10-CM | POA: Insufficient documentation

## 2014-04-15 DIAGNOSIS — A5901 Trichomonal vulvovaginitis: Secondary | ICD-10-CM | POA: Diagnosis not present

## 2014-04-15 DIAGNOSIS — Z3A17 17 weeks gestation of pregnancy: Secondary | ICD-10-CM

## 2014-04-15 DIAGNOSIS — D649 Anemia, unspecified: Secondary | ICD-10-CM | POA: Diagnosis not present

## 2014-04-15 DIAGNOSIS — O9832 Other infections with a predominantly sexual mode of transmission complicating childbirth: Secondary | ICD-10-CM | POA: Insufficient documentation

## 2014-04-15 DIAGNOSIS — Z3A19 19 weeks gestation of pregnancy: Secondary | ICD-10-CM | POA: Insufficient documentation

## 2014-04-15 DIAGNOSIS — O99012 Anemia complicating pregnancy, second trimester: Secondary | ICD-10-CM | POA: Diagnosis not present

## 2014-04-15 DIAGNOSIS — O98312 Other infections with a predominantly sexual mode of transmission complicating pregnancy, second trimester: Secondary | ICD-10-CM

## 2014-04-15 LAB — CBC
HCT: 31.3 % — ABNORMAL LOW (ref 36.0–46.0)
Hemoglobin: 9.9 g/dL — ABNORMAL LOW (ref 12.0–15.0)
MCH: 24.5 pg — ABNORMAL LOW (ref 26.0–34.0)
MCHC: 31.6 g/dL (ref 30.0–36.0)
MCV: 77.5 fL — ABNORMAL LOW (ref 78.0–100.0)
Platelets: 289 10*3/uL (ref 150–400)
RBC: 4.04 MIL/uL (ref 3.87–5.11)
RDW: 15.5 % (ref 11.5–15.5)
WBC: 10.7 10*3/uL — ABNORMAL HIGH (ref 4.0–10.5)

## 2014-04-15 LAB — URINE MICROSCOPIC-ADD ON

## 2014-04-15 LAB — URINALYSIS, ROUTINE W REFLEX MICROSCOPIC
Bilirubin Urine: NEGATIVE
Glucose, UA: NEGATIVE mg/dL
HGB URINE DIPSTICK: NEGATIVE
Ketones, ur: NEGATIVE mg/dL
Leukocytes, UA: NEGATIVE
NITRITE: NEGATIVE
PROTEIN: 30 mg/dL — AB
Specific Gravity, Urine: >1.03 — ABNORMAL HIGH (ref 1.005–1.030)
UROBILINOGEN UA: 0.2 mg/dL (ref 0.0–1.0)
pH: 6 (ref 5.0–8.0)

## 2014-04-15 MED ORDER — METRONIDAZOLE 500 MG PO TABS: 2000.0000 mg | ORAL_TABLET | Freq: Once | ORAL | Status: DC

## 2014-04-15 NOTE — MAU Note (Signed)
Pt states she got dizzy at work. 1510.

## 2014-04-15 NOTE — MAU Note (Signed)
Pt presents complaining of a dizzy spell at work. Pt had positive home pregnancy test in October. Denies vaginal bleeding or discharge. Denies pain

## 2014-04-15 NOTE — MAU Provider Note (Signed)
History     CSN: [id number]  Arrival date and time: 04/15/14 1605   First Provider Initiated Contact with Patient 04/15/14 1845      Chief Complaint  Patient presents with  . Dizziness   HPI  34 y.o.[id number]@[redacted]w[redacted]d  presents to the MAU because she had a dizzy spell at work earlier today around 300pm that lasted for 10 minutes. She is not expereincing any symptoms now. She states that this has happened many times during this pregnancy. She has not established care yet with an OBGYN but wants to use Dr. Clearance Coots. Denies, vaginal bleeding, LOF. Reports positive fetal movement.     Past Medical History  Diagnosis Date  . Bronchitis   . Vertigo   . HPV in female   . Abnormal Pap smear   . Pregnancy induced hypertension     with previous pregnancy  . Anemia     Past Surgical History  Procedure Laterality Date  . No past surgeries    . Cesarean section N/A 07/09/2013    Procedure: CESAREAN SECTION;  Surgeon: Brock Bad, MD;  Location: [facility];  Service: Obstetrics;  Laterality: N/A;    Family History  Problem Relation Age of Onset  . Stroke Father   . Asthma Maternal Grandmother   . Alzheimer's disease Maternal Grandmother   . Cancer Maternal Grandfather     throat and lung    History  Substance Use Topics  . Smoking status: Never Smoker   . Smokeless tobacco: Never Used  . Alcohol Use: No    Allergies: No Known Allergies  Prescriptions prior to admission  Medication Sig Dispense Refill Last Dose  . ferrous sulfate 325 (65 FE) MG tablet Take 325 mg by mouth daily with breakfast.   Past Week at Unknown time  . Prenatal Vit-Fe Fumarate-FA (PRENATAL MULTIVITAMIN) TABS tablet Take 1 tablet by mouth daily at 12 noon.   Past Week at Unknown time  . ibuprofen (ADVIL,MOTRIN) 800 MG tablet Take 1 tablet (800 mg total) by mouth every 8 (eight) hours as needed. (Patient not taking: Reported on 04/15/2014) 30 tablet 5 more than one month  . tinidazole (TINDAMAX) 500 MG tablet  Take 2 tablets (1,000 mg total) by mouth daily with breakfast. (Patient not taking: Reported on 04/15/2014) 10 tablet 0     Review of Systems  Constitutional: Negative for fever.  Eyes: Negative for blurred vision.  Cardiovascular: Negative for chest pain.  Gastrointestinal: Negative for heartburn, nausea, vomiting, abdominal pain, diarrhea and constipation.  Genitourinary: Negative for dysuria, urgency and frequency.  Musculoskeletal: Negative for back pain.  Neurological: Positive for dizziness. Negative for headaches.   Physical Exam   Blood pressure 105/67, pulse 75, temperature 98.4 F (36.9 C), temperature source Oral, resp. rate 18, height  (1.575 m), weight 99.791 kg (220 lb), last menstrual period 11/30/2013, currently breastfeeding.  Physical Exam  Constitutional: She is oriented to person, place, and time. She appears well-developed and well-nourished. No distress.  HENT:  Head: Normocephalic and atraumatic.  Cardiovascular: Normal rate.   Respiratory: Effort normal. No respiratory distress.  Musculoskeletal: Normal range of motion. She exhibits no edema.  Neurological: She is alert and oriented to person, place, and time.  Skin: Skin is warm and dry.  Psychiatric: She has a normal mood and affect. Her behavior is normal. Judgment and thought content normal.    MAU Course  Procedures  CBC Doppler Fetal Heart Tones- Positive Results for orders placed or performed during the hospital encounter  of 04/15/14 (from the past 24 hour(s))  Urinalysis, Routine w reflex microscopic     Status: Abnormal   Collection Time: 04/15/14  4:50 PM  Result Value Ref Range   Color, Urine YELLOW YELLOW   APPearance CLEAR CLEAR   Specific Gravity, Urine >1.030 (H) 1.005 - 1.030   pH 6.0 5.0 - 8.0   Glucose, UA NEGATIVE NEGATIVE mg/dL   Hgb urine dipstick NEGATIVE NEGATIVE   Bilirubin Urine NEGATIVE NEGATIVE   Ketones, ur NEGATIVE NEGATIVE mg/dL   Protein, ur 30 (A) NEGATIVE mg/dL    Urobilinogen, UA 0.2 0.0 - 1.0 mg/dL   Nitrite NEGATIVE NEGATIVE   Leukocytes, UA NEGATIVE NEGATIVE  Urine microscopic-add on     Status: Abnormal   Collection Time: 04/15/14  4:50 PM  Result Value Ref Range   Squamous Epithelial / LPF MANY (A) RARE   WBC, UA 3-6 <3 WBC/hpf   RBC / HPF 0-2 <3 RBC/hpf   Urine-Other TRICHOMONAS PRESENT   CBC     Status: Abnormal   Collection Time: 04/15/14  7:04 PM  Result Value Ref Range   WBC 10.7 (H) 4.0 - 10.5 K/uL   RBC 4.04 3.87 - 5.11 MIL/uL   Hemoglobin 9.9 (L) 12.0 - 15.0 g/dL   HCT 16.131.3 (L) 09.636.0 - 04.546.0 %   MCV 77.5 (L) 78.0 - 100.0 fL   MCH 24.5 (L) 26.0 - 34.0 pg   MCHC 31.6 30.0 - 36.0 g/dL   RDW 40.915.5 81.111.5 - 91.415.5 %   Platelets 289 150 - 400 K/uL    Assessment and Plan  A:  Trichomonas Dizziness      Anemia P: Flagyl 2 gm and treating partner Despina HickJames Goins DOB [birth year] NKDA Flagyl 2 gms po Continue Iron supplement Clemmons,Lori Grissett 04/15/2014, 6:46 PM

## 2014-04-15 NOTE — Discharge Instructions (Signed)
Anemia, Nonspecific Anemia is a condition in which the concentration of red blood cells or hemoglobin in the blood is below normal. Hemoglobin is a substance in red blood cells that carries oxygen to the tissues of the body. Anemia results in not enough oxygen reaching these tissues.  CAUSES  Common causes of anemia include:   Excessive bleeding. Bleeding may be internal or external. This includes excessive bleeding from periods (in women) or from the intestine.   Poor nutrition.   Chronic kidney, thyroid, and liver disease.  Bone marrow disorders that decrease red blood cell production.  Cancer and treatments for cancer.  HIV, AIDS, and their treatments.  Spleen problems that increase red blood cell destruction.  Blood disorders.  Excess destruction of red blood cells due to infection, medicines, and autoimmune disorders. SIGNS AND SYMPTOMS   Minor weakness.   Dizziness.   Headache.  Palpitations.   Shortness of breath, especially with exercise.   Paleness.  Cold sensitivity.  Indigestion.  Nausea.  Difficulty sleeping.  Difficulty concentrating. Symptoms may occur suddenly or they may develop slowly.  DIAGNOSIS  Additional blood tests are often needed. These help your health care provider determine the best treatment. Your health care provider will check your stool for blood and look for other causes of blood loss.  TREATMENT  Treatment varies depending on the cause of the anemia. Treatment can include:   Supplements of iron, vitamin B12, or folic acid.   Hormone medicines.   A blood transfusion. This may be needed if blood loss is severe.   Hospitalization. This may be needed if there is significant continual blood loss.   Dietary changes.  Spleen removal. HOME CARE INSTRUCTIONS Keep all follow-up appointments. It often takes many weeks to correct anemia, and having your health care provider check on your condition and your response to  treatment is very important. SEEK IMMEDIATE MEDICAL CARE IF:   You develop extreme weakness, shortness of breath, or chest pain.   You become dizzy or have trouble concentrating.  You develop heavy vaginal bleeding.   You develop a rash.   You have bloody or black, tarry stools.   You faint.   You vomit up blood.   You vomit repeatedly.   You have abdominal pain.  You have a fever or persistent symptoms for more than 2-3 days.   You have a fever and your symptoms suddenly get worse.   You are dehydrated.  MAKE SURE YOU:  Understand these instructions.  Will watch your condition.  Will get help right away if you are not doing well or get worse. Document Released: 03/23/2004 Document Revised: 10/16/2012 Document Reviewed: 08/09/2012 [facility] Patient Information 2015 [place], [place]. This information is not intended to replace advice given to you by your health care provider. Make sure you discuss any questions you have with your health care provider. Trichomoniasis Trichomoniasis is an infection caused by an organism called Trichomonas. The infection can affect both women and men. In women, the outer female genitalia and the vagina are affected. In men, the penis is mainly affected, but the prostate and other reproductive organs can also be involved. Trichomoniasis is a sexually transmitted infection (STI) and is most often passed to another person through sexual contact.  RISK FACTORS  Having unprotected sexual intercourse.  Having sexual intercourse with an infected partner. SIGNS AND SYMPTOMS  Symptoms of trichomoniasis in women include:  Abnormal gray-green frothy vaginal discharge.  Itching and irritation of the vagina.  Itching and irritation  of the area outside the vagina. Symptoms of trichomoniasis in men include:   Penile discharge with or without pain.  Pain during urination. This results from inflammation of the urethra. DIAGNOSIS    Trichomoniasis may be found during a Pap test or physical exam. Your health care provider may use one of the following methods to help diagnose this infection:  Examining vaginal discharge under a microscope. For men, urethral discharge would be examined.  Testing the pH of the vagina with a test tape.  Using a vaginal swab test that checks for the Trichomonas organism. A test is available that provides results within a few minutes.  Doing a culture test for the organism. This is not usually needed. TREATMENT   You may be given medicine to fight the infection. Women should inform their health care provider if they could be or are pregnant. Some medicines used to treat the infection should not be taken during pregnancy.  Your health care provider may recommend over-the-counter medicines or creams to decrease itching or irritation.  Your sexual partner will need to be treated if infected. HOME CARE INSTRUCTIONS   Take medicines only as directed by your health care provider.  Take over-the-counter medicine for itching or irritation as directed by your health care provider.  Do not have sexual intercourse while you have the infection.  Women should not douche or wear tampons while they have the infection.  Discuss your infection with your partner. Your partner may have gotten the infection from you, or you may have gotten it from your partner.  Have your sex partner get examined and treated if necessary.  Practice safe, informed, and protected sex.  See your health care provider for other STI testing. SEEK MEDICAL CARE IF:   You still have symptoms after you finish your medicine.  You develop abdominal pain.  You have pain when you urinate.  You have bleeding after sexual intercourse.  You develop a rash.  Your medicine makes you sick or makes you throw up (vomit). MAKE SURE YOU:  Understand these instructions.  Will watch your condition.  Will get help right away if  you are not doing well or get worse. Document Released: 08/09/2000 Document Revised: 06/30/2013 Document Reviewed: 11/25/2012 [facility] Patient Information 2015 [place], [place]. This information is not intended to replace advice given to you by your health care provider. Make sure you discuss any questions you have with your health care provider.

## 2014-05-29 ENCOUNTER — Encounter (HOSPITAL_COMMUNITY): Payer: Self-pay

## 2014-05-29 ENCOUNTER — Emergency Department (HOSPITAL_COMMUNITY)
Admission: EM | Admit: 2014-05-29 | Discharge: 2014-05-29 | Disposition: A | Discharge status: Home / Self Care | Payer: Medicaid Other | Attending: Emergency Medicine | Admitting: Emergency Medicine

## 2014-05-29 DIAGNOSIS — Z8709 Personal history of other diseases of the respiratory system: Secondary | ICD-10-CM | POA: Insufficient documentation

## 2014-05-29 DIAGNOSIS — D649 Anemia, unspecified: Secondary | ICD-10-CM | POA: Diagnosis not present

## 2014-05-29 DIAGNOSIS — L259 Unspecified contact dermatitis, unspecified cause: Secondary | ICD-10-CM

## 2014-05-29 DIAGNOSIS — Z79899 Other long term (current) drug therapy: Secondary | ICD-10-CM | POA: Diagnosis not present

## 2014-05-29 DIAGNOSIS — L253 Unspecified contact dermatitis due to other chemical products: Secondary | ICD-10-CM | POA: Insufficient documentation

## 2014-05-29 DIAGNOSIS — Z8619 Personal history of other infectious and parasitic diseases: Secondary | ICD-10-CM | POA: Diagnosis not present

## 2014-05-29 DIAGNOSIS — R51 Headache: Secondary | ICD-10-CM | POA: Diagnosis present

## 2014-05-29 MED ORDER — DEXAMETHASONE SODIUM PHOSPHATE 10 MG/ML IJ SOLN: 10.0000 mg | Freq: Once | INTRAMUSCULAR | Status: DC

## 2014-05-29 MED ORDER — DIPHENHYDRAMINE HCL 25 MG PO TABS: 25.0000 mg | ORAL_TABLET | Freq: Four times a day (QID) | ORAL | Status: DC | PRN

## 2014-05-29 NOTE — ED Notes (Signed)
Pt. Woke up with rash noted to bilateral fore arms, very itchy.

## 2014-05-29 NOTE — ED Provider Notes (Signed)
CSN: [id number]     Arrival date & time 05/29/14  1441 History   This chart was scribed for non-physician practitioner, Trixie DredgeEmily Royetta Probus, PA-C, working with Gwyneth SproutWhitney Plunkett, MD by Charline BillsEssence Howell, ED Scribe. This patient was seen in room TR10C/TR10C and the patient's care was started at 3:47 PM.   Chief Complaint  Patient presents with  . Rash   The history is provided by the patient. No language interpreter was used.   HPI Comments: Anna Neal is a 34 y.o. female who presents to the Emergency Department complaining of sudden onset of itchy rash to bilateral forearms. Pt suspects that rash is from new chemicals at work. 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 Pt denies associated pain, itching or swelling in the mouth or throat, SOB. She also denies new soaps, lotions, shampoos, body sprays, clothes, furniture, recent hotel stays. Pt has been treating with Hydrocortisone cream with mild relief.  Past Medical History  Diagnosis Date  . Bronchitis   . Vertigo   . HPV in female   . Abnormal Pap smear   . Pregnancy induced hypertension     with previous pregnancy  . Anemia    Past Surgical History  Procedure Laterality Date  . No past surgeries    . Cesarean section N/A 07/09/2013    Procedure: CESAREAN SECTION;  Surgeon: Brock Badharles A Harper, MD;  Location: [facility];  Service: Obstetrics;  Laterality: N/A;   Family History  Problem Relation Age of Onset  . Stroke Father   . Asthma Maternal Grandmother   . Alzheimer's disease Maternal Grandmother   . Cancer Maternal Grandfather     throat and lung   History  Substance Use Topics  . Smoking status: Never Smoker   . Smokeless tobacco: Never Used  . Alcohol Use: No   OB History    Gravida Para Term Preterm AB TAB SAB Ectopic Multiple Living   4 3 3       3      Review of Systems  Constitutional: Negative for fever.  HENT: Negative for trouble swallowing.    Respiratory: Negative for shortness of breath.   Gastrointestinal: Negative for abdominal pain.  Genitourinary: Negative for vaginal bleeding and vaginal discharge.  Skin: Positive for rash.  Allergic/Immunologic: Negative for immunocompromised state.  Hematological: Does not bruise/bleed easily.  Psychiatric/Behavioral: Negative for self-injury.   Allergies  Review of patient's allergies indicates no known allergies.  Home Medications   Prior to Admission medications   Medication Sig Start Date End Date Taking? Authorizing Provider  ferrous sulfate 325 (65 FE) MG tablet Take 325 mg by mouth daily with breakfast.    Historical Provider, MD  metroNIDAZOLE (FLAGYL) 500 MG tablet Take 4 tablets (2,000 mg total) by mouth once. 04/15/14   Elmore GuiseLori A Clemmons, CNM  Prenatal Vit-Fe Fumarate-FA (PRENATAL MULTIVITAMIN) TABS tablet Take 1 tablet by mouth daily at 12 noon.    Historical Provider, MD   BP 110/69 mmHg  Pulse 80  Temp(Src) 97.8 F (36.6 C)  Resp 18  SpO2 98%  LMP 11/30/2013 (Approximate) Physical Exam  Constitutional: She appears well-developed and well-nourished. No distress.  HENT:  Head: Normocephalic and atraumatic.  Mouth/Throat: Oropharynx is clear and moist. No posterior oropharyngeal erythema.  Neck: Neck supple.  Pulmonary/Chest: Effort normal.  Neurological: She is alert.  Skin: Rash noted. She is not diaphoretic.  Diffuse papular rash over bilateral forearms,  R greater than L. Spares the hands and the upper arms.   Nursing note and vitals reviewed.  ED Course  Procedures (including critical care time) DIAGNOSTIC STUDIES: Oxygen Saturation is 98% on RA, normal by my interpretation.    COORDINATION OF CARE: 3:52 PM-Discussed treatment plan which includes continue hydrocortisone cream with pt at bedside and pt agreed to plan.   Labs Review Labs Reviewed - No data to display  Imaging Review No results found.   EKG Interpretation None       I have  discussed with patient the safety of medications in pregnancy, the fact that our knowledge of drug safety in pregnancy is limited and that I can only make recommendations based on what is currently known at this time and the safety ratings given to medications.  We discussed that medications may pass through the placenta and have encouraged them to make their own decisions regarding the medications used in light of this information.    MDM   Final diagnoses:  Contact dermatitis    Afebrile, nontoxic patient with pruritic rash on right forearm only.  Likely due to chemical exposure at work.  No airway concerns.  No e/o infection.   D/C home with benadryl PRN, continued hydrocortisone PRN.  Discussed result, findings, treatment, and follow up  with patient.  Pt given return precautions.  Pt verbalizes understanding and agrees with plan.       I personally performed the services described in this documentation, which was scribed in my presence. The recorded information has been reviewed and is accurate.    Trixie Dredge, PA-C 05/29/14 1612  Gwyneth Sprout, MD 05/30/14 [phone]

## 2014-05-29 NOTE — Discharge Instructions (Signed)
Read the information below.  Use the prescribed medication as directed.  Please discuss all new medications with your pharmacist.  You may return to the Emergency Department at any time for worsening condition or any new symptoms that concern you.   If you develop redness, swelling, pus draining from the wound, or fevers greater than 100.4, return to the ER immediately for a recheck.   If you develop itching or swelling in your mouth or throat or have trouble swallowing or breathing, call 911 or return to the ED immediately.      Contact Dermatitis Contact dermatitis is a rash that happens when something touches the skin. You touched something that irritates your skin, or you have allergies to something you touched. HOME CARE   Avoid the thing that caused your rash.  Keep your rash away from hot water, soap, sunlight, chemicals, and other things that might bother it.  Do not scratch your rash.  You can take cool baths to help stop itching.  Only take medicine as told by your doctor.  Keep all doctor visits as told. GET HELP RIGHT AWAY IF:   Your rash is not better after 3 days.  Your rash gets worse.  Your rash is puffy (swollen), tender, red, sore, or warm.  You have problems with your medicine. MAKE SURE YOU:   Understand these instructions.  Will watch your condition.  Will get help right away if you are not doing well or get worse. Document Released: 12/11/2008 Document Revised: 05/08/2011 Document Reviewed: 07/19/2010 [facility] Patient Information 2015 [place], [place]. This information is not intended to replace advice given to you by your health care provider. Make sure you discuss any questions you have with your health care provider.

## 2014-07-09 ENCOUNTER — Other Ambulatory Visit (HOSPITAL_COMMUNITY): Payer: Self-pay | Admitting: Urology

## 2014-07-09 DIAGNOSIS — Z3689 Encounter for other specified antenatal screening: Secondary | ICD-10-CM

## 2014-07-09 LAB — OB RESULTS CONSOLE GC/CHLAMYDIA: GC PROBE AMP, GENITAL: POSITIVE

## 2014-07-12 ENCOUNTER — Inpatient Hospital Stay (HOSPITAL_COMMUNITY)
Admission: AD | Admit: 2014-07-12 | Discharge: 2014-07-12 | Disposition: A | Discharge status: Home / Self Care | Payer: Medicaid Other | Source: Ambulatory Visit | Attending: Obstetrics and Gynecology | Admitting: Obstetrics and Gynecology

## 2014-07-12 ENCOUNTER — Encounter (HOSPITAL_COMMUNITY): Payer: Self-pay

## 2014-07-12 DIAGNOSIS — Z3A32 32 weeks gestation of pregnancy: Secondary | ICD-10-CM | POA: Diagnosis not present

## 2014-07-12 DIAGNOSIS — R42 Dizziness and giddiness: Secondary | ICD-10-CM | POA: Insufficient documentation

## 2014-07-12 DIAGNOSIS — O9989 Other specified diseases and conditions complicating pregnancy, childbirth and the puerperium: Secondary | ICD-10-CM | POA: Insufficient documentation

## 2014-07-12 LAB — URINALYSIS, ROUTINE W REFLEX MICROSCOPIC
Bilirubin Urine: NEGATIVE
Glucose, UA: NEGATIVE mg/dL
HGB URINE DIPSTICK: NEGATIVE
KETONES UR: NEGATIVE mg/dL
Leukocytes, UA: NEGATIVE
Nitrite: NEGATIVE
PH: 7 (ref 5.0–8.0)
PROTEIN: NEGATIVE mg/dL
SPECIFIC GRAVITY, URINE: 1.02 (ref 1.005–1.030)
UROBILINOGEN UA: 2 mg/dL — AB (ref 0.0–1.0)

## 2014-07-12 NOTE — Discharge Instructions (Signed)
Dizziness °Dizziness is a common problem. It is a feeling of unsteadiness or light-headedness. You may feel like you are about to faint. Dizziness can lead to injury if you stumble or fall. A person of any age group can suffer from dizziness, but dizziness is more common in older adults. °CAUSES  °Dizziness can be caused by many different things, including: °· Middle ear problems. °· Standing for too long. °· Infections. °· An allergic reaction. °· Aging. °· An emotional response to something, such as the sight of blood. °· Side effects of medicines. °· Tiredness. °· Problems with circulation or blood pressure. °· Excessive use of alcohol or medicines, or illegal drug use. °· Breathing too fast (hyperventilation). °· An irregular heart rhythm (arrhythmia). °· A low red blood cell count (anemia). °· Pregnancy. °· Vomiting, diarrhea, fever, or other illnesses that cause body fluid loss (dehydration). °· Diseases or conditions such as Parkinson's disease, high blood pressure (hypertension), diabetes, and thyroid problems. °· Exposure to extreme heat. °DIAGNOSIS  °Your health care provider will ask about your symptoms, perform a physical exam, and perform an electrocardiogram (ECG) to record the electrical activity of your heart. Your health care provider may also perform other heart or blood tests to determine the cause of your dizziness. These may include: °· Transthoracic echocardiogram (TTE). During echocardiography, sound waves are used to evaluate how blood flows through your heart. °· Transesophageal echocardiogram (TEE). °· Cardiac monitoring. This allows your health care provider to monitor your heart rate and rhythm in real time. °· Holter monitor. This is a portable device that records your heartbeat and can help diagnose heart arrhythmias. It allows your health care provider to track your heart activity for several days if needed. °· Stress tests by exercise or by giving medicine that makes the heart beat  faster. °TREATMENT  °Treatment of dizziness depends on the cause of your symptoms and can vary greatly. °HOME CARE INSTRUCTIONS  °· Drink enough fluids to keep your urine clear or pale yellow. This is especially important in very hot weather. In older adults, it is also important in cold weather. °· Take your medicine exactly as directed if your dizziness is caused by medicines. When taking blood pressure medicines, it is especially important to get up slowly. °¨ Rise slowly from chairs and steady yourself until you feel okay. °¨ In the morning, first sit up on the side of the bed. When you feel okay, stand slowly while holding onto something until you know your balance is fine. °· Move your legs often if you need to stand in one place for a long time. Tighten and relax your muscles in your legs while standing. °· Have someone stay with you for 1-2 days if dizziness continues to be a problem. Do this until you feel you are well enough to stay alone. Have the person call your health care provider if he or she notices changes in you that are concerning. °· Do not drive or use heavy machinery if you feel dizzy. °· Do not drink alcohol. °SEEK IMMEDIATE MEDICAL CARE IF:  °· Your dizziness or light-headedness gets worse. °· You feel nauseous or vomit. °· You have problems talking, walking, or using your arms, hands, or legs. °· You feel weak. °· You are not thinking clearly or you have trouble forming sentences. It may take a friend or family member to notice this. °· You have chest pain, abdominal pain, shortness of breath, or sweating. °· Your vision changes. °· You notice   any bleeding.  You have side effects from medicine that seems to be getting worse rather than better. MAKE SURE YOU:   Understand these instructions.  Will watch your condition.  Will get help right away if you are not doing well or get worse. Document Released: 08/09/2000 Document Revised: 02/18/2013 Document Reviewed: 09/02/2010 [facility]  Patient Information 2015 [place], [place]. This information is not intended to replace advice given to you by your health care provider. Make sure you discuss any questions you have with your health care provider. Safe Medications in Pregnancy   Acne: Benzoyl Peroxide Salicylic Acid  Backache/Headache: Tylenol: 2 regular strength every 4 hours OR              2 Extra strength every 6 hours  Colds/Coughs/Allergies: Benadryl (alcohol free) 25 mg every 6 hours as needed Breath right strips Claritin Cepacol throat lozenges Chloraseptic throat spray Cold-Eeze- up to three times per day Cough drops, alcohol free Flonase (by prescription only) Guaifenesin Mucinex Robitussin DM (plain only, alcohol free) Saline nasal spray/drops Sudafed (pseudoephedrine) & Actifed ** use only after [redacted] weeks gestation and if you do not have high blood pressure Tylenol Vicks Vaporub Zinc lozenges Zyrtec   Constipation: Colace Ducolax suppositories Fleet enema Glycerin suppositories Metamucil Milk of magnesia Miralax Senokot Smooth move tea  Diarrhea: Kaopectate Imodium A-D  *NO pepto Bismol  Hemorrhoids: Anusol Anusol HC Preparation H Tucks  Indigestion: Tums Maalox Mylanta Zantac  Pepcid  Insomnia: Benadryl (alcohol free) 25mg  every 6 hours as needed Tylenol PM Unisom, no Gelcaps  Leg Cramps: Tums MagGel  Nausea/Vomiting:  Bonine Dramamine Emetrol Ginger extract Sea bands Meclizine  Nausea medication to take during pregnancy:  Unisom (doxylamine succinate 25 mg tablets) Take one tablet daily at bedtime. If symptoms are not adequately controlled, the dose can be increased to a maximum recommended dose of two tablets daily (1/2 tablet in the morning, 1/2 tablet mid-afternoon and one at bedtime). Vitamin B6 100mg  tablets. Take one tablet twice a day (up to 200 mg per day).  Skin Rashes: Aveeno products Benadryl cream or 25mg  every 6 hours as needed Calamine  Lotion 1% cortisone cream  Yeast infection: Gyne-lotrimin 7 Monistat 7   **If taking multiple medications, please check labels to avoid duplicating the same active ingredients **take medication as directed on the label ** Do not exceed 4000 mg of tylenol in 24 hours **Do not take medications that contain aspirin or ibuprofen

## 2014-07-12 NOTE — MAU Provider Note (Signed)
History     CSN: [id number]  Arrival date and time: 07/12/14 1344   First Provider Initiated Contact with Patient 07/12/14 1429      Chief Complaint  Patient presents with  . light headedness    HPI Patient is 34 y.o. [id number] here with complaints of dizziness since Friday.  She states that episodes last about 10 minutes, resolve with sitting and drinking some fluid, then recurs about 2-3 hours later.  Endorses a head cold that has been going on for about 1 week.  Has not taken any medications.  Endorses stuffy nose, intermittent cough and headache.  No sore throat, vision changes, falls, syncope, diarrhea.  Intermittent nausea/vomiting after eating she gets off work.  She thinks she eats too fast/too much.  Has been working 11-12 hour shifts this weekend for graduation, which she typically does not work (normally less than 8h).  She has not been eating or drinking enough on these long shifts, citing that often she does not get to drink even 1 glass of water during an entire shift.  Patient takes only PNV. +FM, denies LOF, VB, contractions, vaginal discharge.  OB History    Gravida Para Term Preterm AB TAB SAB Ectopic Multiple Living   4 3 3       3       Past Medical History  Diagnosis Date  . Bronchitis   . Vertigo   . HPV in female   . Abnormal Pap smear   . Pregnancy induced hypertension     with previous pregnancy  . Anemia     Past Surgical History  Procedure Laterality Date  . No past surgeries    . Cesarean section N/A 07/09/2013    Procedure: CESAREAN SECTION;  Surgeon: Brock Badharles A Harper, MD;  Location: [facility];  Service: Obstetrics;  Laterality: N/A;    Family History  Problem Relation Age of Onset  . Stroke Father   . Asthma Maternal Grandmother   . Alzheimer's disease Maternal Grandmother   . Cancer Maternal Grandfather     throat and lung    History  Substance Use Topics  . Smoking status: Never Smoker   . Smokeless tobacco: Never Used  . Alcohol  Use: No    Allergies: No Known Allergies  Prescriptions prior to admission  Medication Sig Dispense Refill Last Dose  . Prenatal Vit-Fe Fumarate-FA (PNV PRENATAL PLUS MULTIVITAMIN) 27-1 MG TABS Take 1 tablet by mouth.   07/12/2014 at Unknown time  . diphenhydrAMINE (BENADRYL) 25 MG tablet Take 1 tablet (25 mg total) by mouth every 6 (six) hours as needed for itching or allergies. 20 tablet 0   . ferrous sulfate 325 (65 FE) MG tablet Take 325 mg by mouth daily with breakfast.   Past Week at Unknown time  . metroNIDAZOLE (FLAGYL) 500 MG tablet Take 4 tablets (2,000 mg total) by mouth once. (Patient not taking: Reported on 07/12/2014) 4 tablet 0 Completed Course at Unknown time  . Prenatal Vit-Fe Fumarate-FA (PRENATAL MULTIVITAMIN) TABS tablet Take 1 tablet by mouth daily at 12 noon.   Past Week at Unknown time    Review of Systems  Constitutional: Negative for fever and chills.  HENT: Negative for congestion and sore throat.   Eyes: Negative for blurred vision and double vision.  Respiratory: Positive for cough (dry intermittent). Negative for sputum production and shortness of breath.   Cardiovascular: Negative for chest pain, palpitations and leg swelling.  Gastrointestinal: Positive for nausea and vomiting (after overeating).  Negative for heartburn, abdominal pain and diarrhea.  Genitourinary: Negative for dysuria, urgency, frequency and hematuria.  Musculoskeletal: Negative for falls.  Neurological: Positive for dizziness (intermittent, no Vertigo). Negative for sensory change, focal weakness and loss of consciousness.   Physical Exam   Blood pressure 126/71, pulse 97, temperature 97.7 F (36.5 C), resp. rate 18, height 5\' 3"  (1.6 m), weight 218 lb (98.884 kg), last menstrual period 11/30/2013, currently breastfeeding.  Physical Exam  Constitutional: She is oriented to person, place, and time. She appears well-developed and well-nourished. No distress.  HENT:  Head: Normocephalic and  atraumatic.  Eyes: EOM are normal. Pupils are equal, round, and reactive to light. No scleral icterus.  Neck: Normal range of motion. Neck supple.  Cardiovascular: Normal rate, regular rhythm, normal heart sounds and intact distal pulses.   Respiratory: Effort normal and breath sounds normal. No respiratory distress. She has no wheezes.  GI: Soft. There is no tenderness. There is no rebound and no guarding.  Gravid  Musculoskeletal: Normal range of motion. She exhibits no edema or tenderness.  Neurological: She is alert and oriented to person, place, and time. No cranial nerve deficit.  No nystagmus  Skin: Skin is warm and dry. She is not diaphoretic.  Psychiatric: She has a normal mood and affect. Her behavior is normal. Judgment and thought content normal.   Fetal monitoringBaseline: 130 bpm, Variability: Good {> 6 bpm) and Accelerations: Reactive Uterine activityNone  Results for orders placed or performed during the hospital encounter of 07/12/14 (from the past 24 hour(s))  Urinalysis, Routine w reflex microscopic     Status: Abnormal   Collection Time: 07/12/14  2:10 PM  Result Value Ref Range   Color, Urine YELLOW YELLOW   APPearance CLEAR CLEAR   Specific Gravity, Urine 1.020 1.005 - 1.030   pH 7.0 5.0 - 8.0   Glucose, UA NEGATIVE NEGATIVE mg/dL   Hgb urine dipstick NEGATIVE NEGATIVE   Bilirubin Urine NEGATIVE NEGATIVE   Ketones, ur NEGATIVE NEGATIVE mg/dL   Protein, ur NEGATIVE NEGATIVE mg/dL   Urobilinogen, UA 2.0 (H) 0.0 - 1.0 mg/dL   Nitrite NEGATIVE NEGATIVE   Leukocytes, UA NEGATIVE NEGATIVE    MAU Course  Procedures  MDM NST reactive Orthostatic VS UA  Assessment and Plan  Suspect dizziness 2/2 inadequate oral intake at work.  No evidence of neurologic deficits on exam. Orthostatic VS negative. UA without signs of infection or marked dehydration. -Encouraged PO hydration, PO intake -Letter to work asking to allow for small breaks every few hours -Return  precautions discussed -Patient to follow up with OB as scheduled for Tues 5/17 @[facility]  HD  Delynn FlavinGottschalk, Ashly M, DO 07/12/2014, 2:30 PM

## 2014-07-12 NOTE — MAU Note (Signed)
Pt presents to MAU with complaints of light headedness since yesterday while standing. Denies any vaginal bleeding or LOF. States baby is active

## 2014-07-14 ENCOUNTER — Ambulatory Visit (HOSPITAL_COMMUNITY)
Admission: RE | Admit: 2014-07-14 | Discharge: 2014-07-14 | Disposition: A | Discharge status: Home / Self Care | Payer: Medicaid Other | Source: Ambulatory Visit | Attending: Urology | Admitting: Urology

## 2014-07-14 DIAGNOSIS — Z36 Encounter for antenatal screening of mother: Secondary | ICD-10-CM | POA: Diagnosis present

## 2014-07-14 DIAGNOSIS — Z3689 Encounter for other specified antenatal screening: Secondary | ICD-10-CM | POA: Insufficient documentation

## 2014-07-14 DIAGNOSIS — O139 Gestational [pregnancy-induced] hypertension without significant proteinuria, unspecified trimester: Secondary | ICD-10-CM | POA: Insufficient documentation

## 2014-07-14 DIAGNOSIS — Z3A32 32 weeks gestation of pregnancy: Secondary | ICD-10-CM | POA: Insufficient documentation

## 2014-07-14 LAB — OB RESULTS CONSOLE ABO/RH: RH Type: POSITIVE

## 2014-07-14 LAB — OB RESULTS CONSOLE RUBELLA ANTIBODY, IGM: Rubella: IMMUNE

## 2014-07-14 LAB — OB RESULTS CONSOLE ANTIBODY SCREEN: Antibody Screen: NEGATIVE

## 2014-07-14 LAB — OB RESULTS CONSOLE VARICELLA ZOSTER ANTIBODY, IGG: Varicella: IMMUNE

## 2014-07-16 ENCOUNTER — Encounter: Payer: Self-pay | Admitting: *Deleted

## 2014-07-23 ENCOUNTER — Other Ambulatory Visit (HOSPITAL_COMMUNITY): Payer: Self-pay | Admitting: Nurse Practitioner

## 2014-07-23 DIAGNOSIS — Z3689 Encounter for other specified antenatal screening: Secondary | ICD-10-CM

## 2014-07-24 ENCOUNTER — Encounter: Payer: Self-pay | Admitting: *Deleted

## 2014-08-11 ENCOUNTER — Ambulatory Visit (HOSPITAL_COMMUNITY)
Admission: RE | Admit: 2014-08-11 | Discharge: 2014-08-11 | Disposition: A | Discharge status: Home / Self Care | Payer: Medicaid Other | Source: Ambulatory Visit | Attending: Nurse Practitioner | Admitting: Nurse Practitioner

## 2014-08-11 ENCOUNTER — Ambulatory Visit (HOSPITAL_COMMUNITY): Payer: Medicaid Other

## 2014-08-11 DIAGNOSIS — Z36 Encounter for antenatal screening of mother: Secondary | ICD-10-CM | POA: Diagnosis not present

## 2014-08-11 DIAGNOSIS — Z3689 Encounter for other specified antenatal screening: Secondary | ICD-10-CM

## 2014-08-12 DIAGNOSIS — Z0489 Encounter for examination and observation for other specified reasons: Secondary | ICD-10-CM | POA: Insufficient documentation

## 2014-08-12 DIAGNOSIS — IMO0002 Reserved for concepts with insufficient information to code with codable children: Secondary | ICD-10-CM | POA: Insufficient documentation

## 2014-08-12 DIAGNOSIS — Z3A35 35 weeks gestation of pregnancy: Secondary | ICD-10-CM | POA: Insufficient documentation

## 2014-08-14 ENCOUNTER — Encounter (INDEPENDENT_AMBULATORY_CARE_PROVIDER_SITE_OTHER): Payer: Self-pay

## 2014-08-14 ENCOUNTER — Other Ambulatory Visit: Payer: Self-pay | Admitting: Obstetrics & Gynecology

## 2014-08-14 ENCOUNTER — Encounter (HOSPITAL_COMMUNITY)
Admission: RE | Admit: 2014-08-14 | Discharge: 2014-08-14 | Disposition: A | Discharge status: Home / Self Care | Payer: Medicaid Other | Source: Ambulatory Visit | Attending: Obstetrics and Gynecology | Admitting: Obstetrics and Gynecology

## 2014-08-14 ENCOUNTER — Inpatient Hospital Stay (HOSPITAL_COMMUNITY)
Admission: AD | Admit: 2014-08-14 | Discharge: 2014-08-14 | Disposition: A | Discharge status: Home / Self Care | Payer: Medicaid Other | Source: Ambulatory Visit | Attending: Obstetrics & Gynecology | Admitting: Obstetrics & Gynecology

## 2014-08-14 ENCOUNTER — Encounter (HOSPITAL_COMMUNITY): Payer: Self-pay

## 2014-08-14 DIAGNOSIS — Z01818 Encounter for other preprocedural examination: Secondary | ICD-10-CM | POA: Insufficient documentation

## 2014-08-14 DIAGNOSIS — Z3A36 36 weeks gestation of pregnancy: Secondary | ICD-10-CM | POA: Insufficient documentation

## 2014-08-14 HISTORY — DX: Other specified pregnancy related conditions, unspecified trimester: O26.899

## 2014-08-14 HISTORY — DX: Other specified pregnancy related conditions, unspecified trimester: R12

## 2014-08-14 LAB — CBC
HCT: 28.7 % — ABNORMAL LOW (ref 36.0–46.0)
HEMOGLOBIN: 8.7 g/dL — AB (ref 12.0–15.0)
MCH: 22.5 pg — ABNORMAL LOW (ref 26.0–34.0)
MCHC: 30.3 g/dL (ref 30.0–36.0)
MCV: 74.2 fL — ABNORMAL LOW (ref 78.0–100.0)
Platelets: 273 10*3/uL (ref 150–400)
RBC: 3.87 MIL/uL (ref 3.87–5.11)
RDW: 20.1 % — ABNORMAL HIGH (ref 11.5–15.5)
WBC: 9 10*3/uL (ref 4.0–10.5)

## 2014-08-14 MED ORDER — BETAMETHASONE SOD PHOS & ACET 6 (3-3) MG/ML IJ SUSP
12.0000 mg | Freq: Once | INTRAMUSCULAR | Status: AC
  Administered 2014-08-14: 12 mg via INTRAMUSCULAR
  Filled 2014-08-14: qty 2

## 2014-08-14 NOTE — MAU Note (Signed)
Medication and injections explained

## 2014-08-14 NOTE — Patient Instructions (Signed)
Your procedure is scheduled on:08/17/14  Enter through the Main Entrance at :1pm Pick up desk phone and dial [phone] and inform us of your arrival.  Please call [phone] if you have any problems the morning of surgery.  Remember: Do not eat food after midnight:Sunday Clear liquids are ok until:10 am on Monday   You may brush your teeth the morning of surgery.   DO NOT wear jewelry, eye make-up, lipstick,body lotion, or dark fingernail polish.  (Polished toes are ok) You may wear deodorant.  If you are to be admitted after surgery, leave suitcase in car until your room has been assigned. Patients discharged on the day of surgery will not be allowed to drive home. Wear loose fitting, comfortable clothes for your ride home.

## 2014-08-15 ENCOUNTER — Other Ambulatory Visit: Payer: Self-pay | Admitting: Obstetrics and Gynecology

## 2014-08-15 ENCOUNTER — Inpatient Hospital Stay (HOSPITAL_COMMUNITY)
Admission: AD | Admit: 2014-08-15 | Discharge: 2014-08-15 | Disposition: A | Discharge status: Home / Self Care | Payer: Medicaid Other | Source: Ambulatory Visit | Attending: Obstetrics & Gynecology | Admitting: Obstetrics & Gynecology

## 2014-08-15 DIAGNOSIS — Z3A36 36 weeks gestation of pregnancy: Secondary | ICD-10-CM | POA: Diagnosis not present

## 2014-08-15 LAB — RPR: RPR Ser Ql: NONREACTIVE

## 2014-08-15 MED ORDER — BETAMETHASONE SOD PHOS & ACET 6 (3-3) MG/ML IJ SUSP
12.0000 mg | Freq: Once | INTRAMUSCULAR | Status: AC
  Administered 2014-08-15: 12 mg via INTRAMUSCULAR
  Filled 2014-08-15: qty 2

## 2014-08-15 NOTE — MAU Note (Signed)
Pt presents to MAU for 2nd dose of betamethasone injection. Pt scheduled for repeat C/S on Monday June the 20th. Denies any pain of vaginal bleeding

## 2014-08-17 ENCOUNTER — Encounter (HOSPITAL_COMMUNITY)
Admission: RE | Disposition: A | Discharge status: Home / Self Care | Payer: Self-pay | Source: Ambulatory Visit | Attending: Obstetrics and Gynecology

## 2014-08-17 ENCOUNTER — Encounter (HOSPITAL_COMMUNITY): Payer: Self-pay | Admitting: *Deleted

## 2014-08-17 ENCOUNTER — Inpatient Hospital Stay (HOSPITAL_COMMUNITY): Payer: Medicaid Other | Admitting: Anesthesiology

## 2014-08-17 ENCOUNTER — Inpatient Hospital Stay (HOSPITAL_COMMUNITY)
Admission: RE | Admit: 2014-08-17 | Discharge: 2014-08-19 | DRG: 765 | Disposition: A | Discharge status: Home / Self Care | Payer: Medicaid Other | Source: Ambulatory Visit | Attending: Obstetrics and Gynecology | Admitting: Obstetrics and Gynecology

## 2014-08-17 DIAGNOSIS — Z6841 Body Mass Index (BMI) 40.0 and over, adult: Secondary | ICD-10-CM

## 2014-08-17 DIAGNOSIS — Z3A36 36 weeks gestation of pregnancy: Secondary | ICD-10-CM | POA: Diagnosis present

## 2014-08-17 DIAGNOSIS — Z98891 History of uterine scar from previous surgery: Secondary | ICD-10-CM

## 2014-08-17 DIAGNOSIS — Z823 Family history of stroke: Secondary | ICD-10-CM | POA: Diagnosis not present

## 2014-08-17 DIAGNOSIS — O9902 Anemia complicating childbirth: Secondary | ICD-10-CM | POA: Diagnosis present

## 2014-08-17 DIAGNOSIS — O3421 Maternal care for scar from previous cesarean delivery: Secondary | ICD-10-CM | POA: Diagnosis present

## 2014-08-17 DIAGNOSIS — O99214 Obesity complicating childbirth: Secondary | ICD-10-CM | POA: Diagnosis present

## 2014-08-17 DIAGNOSIS — Z820 Family history of epilepsy and other diseases of the nervous system: Secondary | ICD-10-CM | POA: Diagnosis not present

## 2014-08-17 LAB — PREPARE RBC (CROSSMATCH)

## 2014-08-17 SURGERY — Surgical Case
Anesthesia: Epidural | Laterality: Bilateral

## 2014-08-17 MED ORDER — ACETAMINOPHEN 160 MG/5ML PO SOLN
ORAL | Status: AC
  Administered 2014-08-17: 975 mg via ORAL
  Filled 2014-08-17: qty 40.6

## 2014-08-17 MED ORDER — SCOPOLAMINE 1 MG/3DAYS TD PT72
MEDICATED_PATCH | TRANSDERMAL | Status: AC
  Administered 2014-08-17: 1.5 mg via TRANSDERMAL
  Filled 2014-08-17: qty 1

## 2014-08-17 MED ORDER — SENNOSIDES-DOCUSATE SODIUM 8.6-50 MG PO TABS
2.0000 | ORAL_TABLET | ORAL | Status: DC
  Administered 2014-08-18 (×2): 2 via ORAL
  Filled 2014-08-17 (×2): qty 2

## 2014-08-17 MED ORDER — NALBUPHINE HCL 10 MG/ML IJ SOLN: 5.0000 mg | Freq: Once | INTRAMUSCULAR | Status: AC | PRN

## 2014-08-17 MED ORDER — NALOXONE HCL 1 MG/ML IJ SOLN: 1.0000 ug/kg/h | INTRAVENOUS | Status: DC | PRN

## 2014-08-17 MED ORDER — ACETAMINOPHEN 500 MG PO TABS: 1000.0000 mg | ORAL_TABLET | Freq: Four times a day (QID) | ORAL | Status: DC

## 2014-08-17 MED ORDER — PHENYLEPHRINE 8 MG IN D5W 100 ML (0.08MG/ML) PREMIX OPTIME
INJECTION | INTRAVENOUS | Status: DC | PRN
  Administered 2014-08-17: 60 ug/min via INTRAVENOUS

## 2014-08-17 MED ORDER — PHENYLEPHRINE HCL 10 MG/ML IJ SOLN
INTRAMUSCULAR | Status: DC | PRN
  Administered 2014-08-17: 80 ug via INTRAVENOUS

## 2014-08-17 MED ORDER — SCOPOLAMINE 1 MG/3DAYS TD PT72
1.0000 | MEDICATED_PATCH | Freq: Once | TRANSDERMAL | Status: DC
  Administered 2014-08-17: 1.5 mg via TRANSDERMAL

## 2014-08-17 MED ORDER — OXYTOCIN 10 UNIT/ML IJ SOLN
40.0000 [IU] | INTRAMUSCULAR | Status: DC | PRN
  Administered 2014-08-17: 40 [IU] via INTRAVENOUS

## 2014-08-17 MED ORDER — DIPHENHYDRAMINE HCL 50 MG/ML IJ SOLN: 12.5000 mg | INTRAMUSCULAR | Status: DC | PRN

## 2014-08-17 MED ORDER — OXYTOCIN 40 UNITS IN LACTATED RINGERS INFUSION - SIMPLE MED: 62.5000 mL/h | INTRAVENOUS | Status: AC

## 2014-08-17 MED ORDER — OXYCODONE-ACETAMINOPHEN 5-325 MG PO TABS: 2.0000 | ORAL_TABLET | ORAL | Status: DC | PRN

## 2014-08-17 MED ORDER — CEFAZOLIN SODIUM-DEXTROSE 2-3 GM-% IV SOLR
INTRAVENOUS | Status: AC
  Filled 2014-08-17: qty 50

## 2014-08-17 MED ORDER — SIMETHICONE 80 MG PO CHEW: 80.0000 mg | CHEWABLE_TABLET | ORAL | Status: DC | PRN

## 2014-08-17 MED ORDER — MENTHOL 3 MG MT LOZG: 1.0000 | LOZENGE | OROMUCOSAL | Status: DC | PRN

## 2014-08-17 MED ORDER — PRENATAL MULTIVITAMIN CH
1.0000 | ORAL_TABLET | Freq: Every day | ORAL | Status: DC
  Administered 2014-08-18 – 2014-08-19 (×2): 1 via ORAL
  Filled 2014-08-17 (×2): qty 1

## 2014-08-17 MED ORDER — LACTATED RINGERS IV SOLN
INTRAVENOUS | Status: DC
  Administered 2014-08-17 – 2014-08-18 (×3): via INTRAVENOUS

## 2014-08-17 MED ORDER — OXYCODONE-ACETAMINOPHEN 5-325 MG PO TABS: 1.0000 | ORAL_TABLET | ORAL | Status: DC | PRN

## 2014-08-17 MED ORDER — SODIUM CHLORIDE 0.9 % IJ SOLN: 3.0000 mL | INTRAMUSCULAR | Status: DC | PRN

## 2014-08-17 MED ORDER — MORPHINE SULFATE (PF) 0.5 MG/ML IJ SOLN
INTRAMUSCULAR | Status: DC | PRN
  Administered 2014-08-17: .1 mg via INTRATHECAL

## 2014-08-17 MED ORDER — NALBUPHINE HCL 10 MG/ML IJ SOLN: 5.0000 mg | INTRAMUSCULAR | Status: DC | PRN

## 2014-08-17 MED ORDER — DIBUCAINE 1 % RE OINT
1.0000 | TOPICAL_OINTMENT | RECTAL | Status: DC | PRN
Start: 2014-08-17 — End: 2014-08-19

## 2014-08-17 MED ORDER — DIPHENHYDRAMINE HCL 25 MG PO CAPS: 25.0000 mg | ORAL_CAPSULE | ORAL | Status: DC | PRN

## 2014-08-17 MED ORDER — BUPIVACAINE IN DEXTROSE 0.75-8.25 % IT SOLN
INTRATHECAL | Status: DC | PRN
  Administered 2014-08-17: 1.2 mL via INTRATHECAL

## 2014-08-17 MED ORDER — FENTANYL CITRATE (PF) 100 MCG/2ML IJ SOLN
INTRAMUSCULAR | Status: DC | PRN
  Administered 2014-08-17: 25 ug via INTRATHECAL

## 2014-08-17 MED ORDER — LACTATED RINGERS IV SOLN
INTRAVENOUS | Status: DC
  Administered 2014-08-17: 14:00:00 via INTRAVENOUS

## 2014-08-17 MED ORDER — MEPERIDINE HCL 25 MG/ML IJ SOLN: 6.2500 mg | INTRAMUSCULAR | Status: DC | PRN

## 2014-08-17 MED ORDER — ACETAMINOPHEN 160 MG/5ML PO SOLN
975.0000 mg | Freq: Once | ORAL | Status: AC
  Administered 2014-08-17: 975 mg via ORAL

## 2014-08-17 MED ORDER — IBUPROFEN 600 MG PO TABS
600.0000 mg | ORAL_TABLET | Freq: Four times a day (QID) | ORAL | Status: DC
  Administered 2014-08-18 – 2014-08-19 (×7): 600 mg via ORAL
  Filled 2014-08-17 (×7): qty 1

## 2014-08-17 MED ORDER — NALBUPHINE HCL 10 MG/ML IJ SOLN: 5.0000 mg | Freq: Once | INTRAMUSCULAR | Status: DC | PRN

## 2014-08-17 MED ORDER — WITCH HAZEL-GLYCERIN EX PADS: 1.0000 "application " | MEDICATED_PAD | CUTANEOUS | Status: DC | PRN

## 2014-08-17 MED ORDER — NALOXONE HCL 0.4 MG/ML IJ SOLN: 0.4000 mg | INTRAMUSCULAR | Status: DC | PRN

## 2014-08-17 MED ORDER — ONDANSETRON HCL 4 MG/2ML IJ SOLN: 4.0000 mg | Freq: Three times a day (TID) | INTRAMUSCULAR | Status: DC | PRN

## 2014-08-17 MED ORDER — CEFAZOLIN SODIUM-DEXTROSE 2-3 GM-% IV SOLR
2.0000 g | INTRAVENOUS | Status: AC
  Administered 2014-08-17: 2 g via INTRAVENOUS

## 2014-08-17 MED ORDER — MORPHINE SULFATE 0.5 MG/ML IJ SOLN
INTRAMUSCULAR | Status: AC
  Filled 2014-08-17: qty 10

## 2014-08-17 MED ORDER — ACETAMINOPHEN 500 MG PO TABS
1000.0000 mg | ORAL_TABLET | Freq: Four times a day (QID) | ORAL | Status: AC
  Administered 2014-08-18 (×3): 1000 mg via ORAL
  Filled 2014-08-17 (×3): qty 2

## 2014-08-17 MED ORDER — DEXTROSE 5 % IV SOLN: 1.0000 ug/kg/h | INTRAVENOUS | Status: DC | PRN

## 2014-08-17 MED ORDER — OXYTOCIN 10 UNIT/ML IJ SOLN
INTRAMUSCULAR | Status: AC
  Filled 2014-08-17: qty 4

## 2014-08-17 MED ORDER — ONDANSETRON HCL 4 MG/2ML IJ SOLN
INTRAMUSCULAR | Status: AC
  Filled 2014-08-17: qty 2

## 2014-08-17 MED ORDER — FENTANYL CITRATE (PF) 100 MCG/2ML IJ SOLN
INTRAMUSCULAR | Status: AC
  Filled 2014-08-17: qty 2

## 2014-08-17 MED ORDER — DIPHENHYDRAMINE HCL 25 MG PO CAPS: 25.0000 mg | ORAL_CAPSULE | Freq: Four times a day (QID) | ORAL | Status: DC | PRN

## 2014-08-17 MED ORDER — LANOLIN HYDROUS EX OINT: 1.0000 "application " | TOPICAL_OINTMENT | CUTANEOUS | Status: DC | PRN

## 2014-08-17 MED ORDER — SCOPOLAMINE 1 MG/3DAYS TD PT72: 1.0000 | MEDICATED_PATCH | Freq: Once | TRANSDERMAL | Status: DC

## 2014-08-17 MED ORDER — TETANUS-DIPHTH-ACELL PERTUSSIS 5-2.5-18.5 LF-MCG/0.5 IM SUSP: 0.5000 mL | Freq: Once | INTRAMUSCULAR | Status: DC

## 2014-08-17 MED ORDER — ONDANSETRON HCL 4 MG/2ML IJ SOLN
INTRAMUSCULAR | Status: DC | PRN
  Administered 2014-08-17: 4 mg via INTRAVENOUS

## 2014-08-17 MED ORDER — LACTATED RINGERS IV SOLN
INTRAVENOUS | Status: DC | PRN
Start: 2014-08-17 — End: 2014-08-17
  Administered 2014-08-17: 15:00:00 via INTRAVENOUS

## 2014-08-17 MED ORDER — ACETAMINOPHEN 325 MG PO TABS: 650.0000 mg | ORAL_TABLET | ORAL | Status: DC | PRN

## 2014-08-17 MED ORDER — PHENYLEPHRINE 8 MG IN D5W 100 ML (0.08MG/ML) PREMIX OPTIME
INJECTION | INTRAVENOUS | Status: AC
  Filled 2014-08-17: qty 100

## 2014-08-17 MED ORDER — SIMETHICONE 80 MG PO CHEW
80.0000 mg | CHEWABLE_TABLET | ORAL | Status: DC
  Administered 2014-08-18 (×2): 80 mg via ORAL
  Filled 2014-08-17 (×2): qty 1

## 2014-08-17 MED ORDER — FENTANYL CITRATE (PF) 100 MCG/2ML IJ SOLN: 25.0000 ug | INTRAMUSCULAR | Status: DC | PRN

## 2014-08-17 MED ORDER — SCOPOLAMINE 1 MG/3DAYS TD PT72
1.0000 | MEDICATED_PATCH | Freq: Once | TRANSDERMAL | Status: DC
Start: 2014-08-17 — End: 2014-08-17

## 2014-08-17 MED ORDER — SIMETHICONE 80 MG PO CHEW
80.0000 mg | CHEWABLE_TABLET | Freq: Three times a day (TID) | ORAL | Status: DC
  Administered 2014-08-18 – 2014-08-19 (×5): 80 mg via ORAL
  Filled 2014-08-17 (×4): qty 1

## 2014-08-17 MED ORDER — PNEUMOCOCCAL VAC POLYVALENT 25 MCG/0.5ML IJ INJ
0.5000 mL | INJECTION | INTRAMUSCULAR | Status: AC
  Administered 2014-08-18: 0.5 mL via INTRAMUSCULAR
  Filled 2014-08-17: qty 0.5

## 2014-08-17 SURGICAL SUPPLY — 32 items
BRR ADH 6X5 SEPRAFILM 1 SHT (MISCELLANEOUS)
CLAMP CORD UMBIL (MISCELLANEOUS) IMPLANT
CONTAINER PREFILL 10% NBF 15ML (MISCELLANEOUS) IMPLANT
DRAPE SHEET LG 3/4 BI-LAMINATE (DRAPES) IMPLANT
DRSG OPSITE POSTOP 4X10 (GAUZE/BANDAGES/DRESSINGS) ×3 IMPLANT
DURAPREP 26ML APPLICATOR (WOUND CARE) ×3 IMPLANT
ELECT REM PT RETURN 9FT ADLT (ELECTROSURGICAL) ×3
ELECTRODE REM PT RTRN 9FT ADLT (ELECTROSURGICAL) ×1 IMPLANT
EXTRACTOR VACUUM M CUP 4 TUBE (SUCTIONS) IMPLANT
EXTRACTOR VACUUM M CUP 4' TUBE (SUCTIONS)
GAUZE SPONGE 4X4 12PLY STRL (GAUZE/BANDAGES/DRESSINGS) ×2 IMPLANT
GLOVE BIOGEL PI IND STRL 6.5 (GLOVE) ×1 IMPLANT
GLOVE BIOGEL PI INDICATOR 6.5 (GLOVE) ×2
GLOVE SURG SS PI 6.0 STRL IVOR (GLOVE) ×3 IMPLANT
GOWN STRL REUS W/TWL LRG LVL3 (GOWN DISPOSABLE) ×6 IMPLANT
KIT ABG SYR 3ML LUER SLIP (SYRINGE) IMPLANT
NDL HYPO 25X5/8 SAFETYGLIDE (NEEDLE) IMPLANT
NEEDLE HYPO 25X5/8 SAFETYGLIDE (NEEDLE) IMPLANT
NS IRRIG 1000ML POUR BTL (IV SOLUTION) ×3 IMPLANT
PACK C SECTION WH (CUSTOM PROCEDURE TRAY) ×3 IMPLANT
PAD ABD 7.5X8 STRL (GAUZE/BANDAGES/DRESSINGS) ×2 IMPLANT
PAD OB MATERNITY 4.3X12.25 (PERSONAL CARE ITEMS) ×3 IMPLANT
RETRACTOR WOUND ALXS 34CM XLRG (MISCELLANEOUS) IMPLANT
RTRCTR C-SECT PINK 25CM LRG (MISCELLANEOUS) IMPLANT
RTRCTR WOUND ALEXIS 34CM XLRG (MISCELLANEOUS) ×3
SEPRAFILM MEMBRANE 5X6 (MISCELLANEOUS) IMPLANT
STAPLER VISISTAT 35W (STAPLE) IMPLANT
SUT PLAIN 0 NONE (SUTURE) ×3 IMPLANT
SUT VIC AB 0 CT1 36 (SUTURE) ×14 IMPLANT
SUT VIC AB 4-0 KS 27 (SUTURE) ×3 IMPLANT
TOWEL OR 17X24 6PK STRL BLUE (TOWEL DISPOSABLE) ×3 IMPLANT
TRAY FOLEY CATH SILVER 14FR (SET/KITS/TRAYS/PACK) ×3 IMPLANT

## 2014-08-17 NOTE — Op Note (Signed)
Anna Neal PROCEDURE DATE: 08/17/2014  PREOPERATIVE DIAGNOSIS: Intrauterine pregnancy at  [redacted]w[redacted]d weeks gestation; previous uterine incision classical and desire for permanent sterilization  POSTOPERATIVE DIAGNOSIS: The same with the exception that the patient decided to preserve her fertility  PROCEDURE:     Cesarean Section   SURGEON:  Dr. Gigi Gin Azucena Dart  ASSISTANT: none  INDICATIONS: Anna Neal is a 34 y.o. [id number] at [redacted]w[redacted]d scheduled for cesarean section secondary to previous uterine incision classical.  The risks of cesarean section discussed with the patient included but were not limited to: bleeding which may require transfusion or reoperation; infection which may require antibiotics; injury to bowel, bladder, ureters or other surrounding organs; injury to the fetus; need for additional procedures including hysterectomy in the event of a life-threatening hemorrhage; placental abnormalities wth subsequent pregnancies, incisional problems, thromboembolic phenomenon and other postoperative/anesthesia complications. The patient concurred with the proposed plan, giving informed written consent for the procedure.  The patient also desires permanent sterilization. Risks and benefits of procedure discussed with patient including permanence of method, bleeding, infection, injury to surrounding organs and need for additional procedures. Risk failure of 0.5-1% with increased risk of ectopic gestation if pregnancy occurs was also discussed with patient.  FINDINGS:  Viable female infant in cephalic presentation.  Apgars 9 and 9, weight, 6 pounds and 7 ounces.  Clear amniotic fluid.  Intact placenta, three vessel cord.  Normal uterus, fallopian tubes and ovaries bilaterally.  ANESTHESIA:    Spinal INTRAVENOUS FLUIDS:1400 ml ESTIMATED BLOOD LOSS: 800 ml URINE OUTPUT:  50 ml SPECIMENS: Placenta sent to L&D COMPLICATIONS: None immediate  PROCEDURE IN DETAIL:  The patient received  intravenous antibiotics and had sequential compression devices applied to her lower extremities while in the preoperative area.  She was then taken to the operating room where anesthesia was induced and was found to be adequate. A foley catheter was placed into her bladder and attached to Anna Neal gravity. She was then placed in a dorsal supine position with a leftward tilt, and prepped and draped in a sterile manner. After an adequate timeout was performed, a Pfannenstiel skin incision was made with scalpel and carried through to the underlying layer of fascia. The fascia was incised in the midline and this incision was extended bilaterally using the Mayo scissors. Kocher clamps were applied to the superior aspect of the fascial incision and the underlying rectus muscles were dissected off bluntly. A similar process was carried out on the inferior aspect of the facial incision. The rectus muscles were separated in the midline bluntly and the peritoneum was entered bluntly. The Alexis self-retaining retractor was introduced into the abdominal cavity. Attention was turned to the lower uterine segment where a transverse hysterotomy was made with a scalpel and extended bilaterally bluntly. The infant was successfully delivered, and cord was clamped and cut and infant was handed over to awaiting neonatology team. Uterine massage was then administered and the placenta delivered intact with three-vessel cord. The uterus was cleared of clot and debris.  The hysterotomy was closed with 0 Vicryl in a running locked fashion, and an imbricating layer was also placed with a 0 Vicryl. Overall, excellent hemostasis was noted. The pelvis copiously irrigated and cleared of all clot and debris. Hemostasis was confirmed on all surfaces. The patient decided against the tubal ligation. The peritoneum and the muscles were reapproximated using 0 vicryl interrupted stitches. The fascia was then closed using 0 Vicryl in a running fashion.   The subcutaneous layer was reapproximated  with plain gut and the skin was closed in a subcuticular fashion using 3.0 Vicryl. The patient tolerated the procedure well. Sponge, lap, instrument and needle counts were correct x 2. She was taken to the recovery room in stable condition.    [name],PEGGYMD  08/17/2014 3:42 PM

## 2014-08-17 NOTE — Lactation Note (Signed)
This note was copied from the chart of Anna Neal. Lactation Consultation Note  Experienced BF mother is easily able to hand express colostrum.  Late preterm infant behavior and feeding guidelines were explained to her.  She is agreeable to following them.  Double electric breast pump set-up a the bedside and explained. Flange #24 is appropriate at this point.  Mom reports baby ate about an hour ago and she was asleep on mom's bare chest. She states baby is latching well. Explained need to see baby on the breast at least every 8 hours.  Information given on support groups and outpatient services.  RN states she will teach mom how to spoon feed.  Patient Name: Anna Manami Lamia Today's Date: 08/17/2014 Reason for consult: Initial assessment;Late preterm infant   Maternal Data Has patient been taught Hand Expression?: Yes Does the patient have breastfeeding experience prior to this delivery?: Yes  Feeding Feeding Type: Breast Fed Length of feed: 20 min  LATCH Score/Interventions                      Lactation Tools Discussed/Used WIC Program: Yes Pump Review: Setup, frequency, and cleaning;Milk Storage Initiated by:: LC Date initiated:: 08/17/14   Consult Status      Soyla Dryer 08/17/2014, 10:26 PM

## 2014-08-17 NOTE — H&P (Signed)
Anna Neal is a 34 y.o. female presenting for scheduled repeat cesarean section with bilateral tubal ligation. [facility] with late onset to care at 31 weeks. Prenatal care complicated by a previous classical cesarean section at term. Patient is otherwise without complaints.  History OB History    Gravida Para Term Preterm AB TAB SAB Ectopic Multiple Living   4 3 3       3      Past Medical History  Diagnosis Date  . Bronchitis   . Vertigo   . HPV in female   . Abnormal Pap smear   . Anemia   . Pregnancy induced hypertension     with previous pregnancy  . Heartburn during pregnancy    Past Surgical History  Procedure Laterality Date  . Cesarean section N/A 07/09/2013    Procedure: CESAREAN SECTION;  Surgeon: Brock Bad, MD;  Location: [facility];  Service: Obstetrics;  Laterality: N/A;   Family History: family history includes Alzheimer's disease in her maternal grandmother; Asthma in her maternal grandmother; Cancer in her maternal grandfather; Stroke in her father. Social History:  reports that she has never smoked. She has never used smokeless tobacco. She reports that she does not drink alcohol or use illicit drugs.   Prenatal Transfer Tool  Maternal Diabetes: No Genetic Screening: Declined Maternal Ultrasounds/Referrals: Normal Fetal Ultrasounds or other Referrals:  None Maternal Substance Abuse:  No Significant Maternal Medications:  None Significant Maternal Lab Results:  None Other Comments:  None  ROS See pertinent in HPI   Blood pressure 124/70, pulse 97, temperature 97.7 F (36.5 C), temperature source Oral, resp. rate 16, last menstrual period 11/30/2013, SpO2 100 %, currently breastfeeding. Exam Physical Exam  GENERAL: Well-developed, well-nourished female in no acute distress.  HEENT: Normocephalic, atraumatic. Sclerae anicteric.  NECK: Supple. Normal thyroid.  LUNGS: Clear to auscultation bilaterally.   HEART: Regular rate and rhythm. BREASTS: Symmetric in size. No palpable masses or lymphadenopathy, skin changes, or nipple drainage. ABDOMEN: Soft, nontender, gravid PELVIC: Not performed EXTREMITIES: No cyanosis, clubbing, or edema, 2+ distal pulses.  Prenatal labs: ABO, Rh: --/--/A POS (06/17 1200) Antibody: NEG (06/17 1200) Rubella: Immune (05/17 0000) RPR: Non Reactive (06/17 1200)  HBsAg:    HIV:    GBS:     Assessment/Plan: 34 yo G4P3003 at 36.3 weeks here for scheduled repeat cesarean section with bilateral tubal ligation Risks and alternatives were explained including but not limited to risks of bleeding, infection and damage to adjacent organs. Patient also desires permanent sterilization. Risks and benefits of procedure discussed with patient including permanence of method, bleeding, infection, injury to surrounding organs and need for additional procedures. Risk failure of 0.5-1% with increased risk of ectopic gestation if pregnancy occurs was also discussed with patient.     Anna Neal 08/17/2014, 2:13 PM

## 2014-08-17 NOTE — Transfer of Care (Signed)
Immediate Anesthesia Transfer of Care Note  Patient: Anna Neal  Procedure(s) Performed: Procedure(s) with comments: CESAREAN SECTION REPEAT WITH BILATERAL TUBAL LIGATION (Bilateral) - previous classic uterine incision. 36 week.  Patient Location: PACU  Anesthesia Type:Spinal  Level of Consciousness: awake, alert  and oriented  Airway & Oxygen Therapy: Patient Spontanous Breathing  Post-op Assessment: Report given to RN and Post -op Vital signs reviewed and stable  Post vital signs: Reviewed and stable  Last Vitals:  Filed Vitals:   08/17/14 1308  BP: 124/70  Pulse: 97  Temp: 36.5 C  Resp: 16    Complications: No apparent anesthesia complications

## 2014-08-17 NOTE — Anesthesia Postprocedure Evaluation (Signed)
  Anesthesia Post-op Note  Patient: Anna Neal  Procedure(s) Performed: Procedure(s) with comments: CESAREAN SECTION REPEAT WITH BILATERAL TUBAL LIGATION (Bilateral) - previous classic uterine incision. 36 week.  Patient is awake, responsive, moving her legs, and has signs of resolution of her numbness. Pain and nausea are reasonably well controlled. Vital signs are stable and clinically acceptable. Oxygen saturation is clinically acceptable. There are no apparent anesthetic complications at this time. Patient is ready for discharge.

## 2014-08-17 NOTE — Anesthesia Procedure Notes (Signed)
Spinal Patient location during procedure: OR Preanesthetic Checklist Completed: patient identified, site marked, surgical consent, pre-op evaluation, timeout performed, IV checked, risks and benefits discussed and monitors and equipment checked Spinal Block Patient position: sitting Prep: DuraPrep Patient monitoring: heart rate, cardiac monitor, continuous pulse ox and blood pressure Approach: midline Location: L3-4 Injection technique: single-shot Needle Needle type: Sprotte  Needle gauge: 24 G Needle length: 9 cm Assessment Sensory level: T4 Additional Notes Spinal Dosage in OR  Bupivicaine ml       1.2 PFMS04   mcg        100 Fentanyl mcg            25    

## 2014-08-17 NOTE — Anesthesia Preprocedure Evaluation (Signed)
Anesthesia Evaluation  Patient identified by MRN, date of birth, ID band Patient awake    Reviewed: Allergy & Precautions, H&P , Patient's Chart, lab work & pertinent test results  Airway Mallampati: II  TM Distance: >3 FB Neck ROM: full    Dental no notable dental hx.    Pulmonary  breath sounds clear to auscultation  Pulmonary exam normal       Cardiovascular Exercise Tolerance: Good hypertension, Rhythm:regular Rate:Normal     Neuro/Psych    GI/Hepatic   Endo/Other  Morbid obesity  Renal/GU      Musculoskeletal   Abdominal   Peds  Hematology  (+) anemia ,   Anesthesia Other Findings Anemic... Hb8.7  Reproductive/Obstetrics                             Anesthesia Physical Anesthesia Plan  ASA: II  Anesthesia Plan: Spinal, Epidural and Combined Spinal and Epidural   Post-op Pain Management:    Induction:   Airway Management Planned:   Additional Equipment:   Intra-op Plan:   Post-operative Plan:   Informed Consent: I have reviewed the patients History and Physical, chart, labs and discussed the procedure including the risks, benefits and alternatives for the proposed anesthesia with the patient or authorized representative who has indicated his/her understanding and acceptance.   Dental Advisory Given  Plan Discussed with: CRNA  Anesthesia Plan Comments: (Lab work confirmed with CRNA in room. Platelets okay. Discussed spinal anesthetic, and patient consents to the procedure:  included risk of possible headache,backache, failed block, allergic reaction, and nerve injury. This patient was asked if she had any questions or concerns before the procedure started. )        Anesthesia Quick Evaluation

## 2014-08-18 ENCOUNTER — Encounter (HOSPITAL_COMMUNITY): Payer: Self-pay | Admitting: Obstetrics and Gynecology

## 2014-08-18 LAB — TYPE AND SCREEN
ABO/RH(D): A POS
Antibody Screen: NEGATIVE
UNIT DIVISION: 0
Unit division: 0

## 2014-08-18 LAB — CBC
HCT: 26.2 % — ABNORMAL LOW (ref 36.0–46.0)
Hemoglobin: 7.8 g/dL — ABNORMAL LOW (ref 12.0–15.0)
MCH: 22.3 pg — AB (ref 26.0–34.0)
MCHC: 29.8 g/dL — ABNORMAL LOW (ref 30.0–36.0)
MCV: 75.1 fL — AB (ref 78.0–100.0)
PLATELETS: 271 10*3/uL (ref 150–400)
RBC: 3.49 MIL/uL — AB (ref 3.87–5.11)
RDW: 20.1 % — AB (ref 11.5–15.5)
WBC: 11.3 10*3/uL — ABNORMAL HIGH (ref 4.0–10.5)

## 2014-08-18 LAB — BIRTH TISSUE RECOVERY COLLECTION (PLACENTA DONATION)

## 2014-08-18 NOTE — Progress Notes (Signed)
CLINICAL SOCIAL WORK MATERNAL/CHILD NOTE  Patient Details  Name: Anna Neal MRN: [id number] Date of Birth: 08/17/2013  Date:  08/18/2014  Clinical Social Worker Initiating Note:  Loleta Books, LCSW Date/ Time Initiated:  08/18/14/1030     Child's Name:  Anna Neal   Legal Guardian:  Burna Sis- Mother  Need for Interpreter:  None   Date of Referral:  08/17/14     Reason for Referral:  Late or No Prenatal Care    Referral Source:  [facility]   Address:  [address] 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 Per MOB, she had a difficult time attending appointments due to work schedule and coping with other household obligations. MOB denied substance use during pregnancy. Infant's UDS is negative and MDS is pending.   Cognitive State:  Able to Concentrate , Alert , Goal Oriented , Linear Thinking    Mood/Affect:  Animated, Comfortable , Calm    CSW Assessment:  CSW received consult due to MOB arriving late to prenatal care (31 weeks).  MOB presented as easily engaged and receptive to the visit. She displayed a full range in affect and was in a pleasant mood. MOB expressed readiness to ambulate since she is not accustomed to not moving around. She was interacting with the infant during the visit, and was noted to be smiling as she discussed this infant's birth.  No mental health symptoms observed or noted  in MOB's thought process.  MOB denied questions, concerns, or needs as she transitions to the postpartum period. She stated that she has three other children at home (ages 66,4, and 1), and reported desire to return home when medically ready since she feels need to provide care to them. She shared belief that they are well taken care of by her mother and brother, but shared that she would prefer to be home with them. MOB endorsed feeling well supported, and discussed that she will continue to cope and balance with caring for herself, her infant, and her other children as she transitions to the postpartum period.  MOB stated that she has all necessary infant items, and also reported feeling supported by her employer while she recovers from the C-section. MOB denied significant mental health history and denied history of postpartum depression/anxiety.   Per MOB, she arrived late to prenatal care due to difficulties taking time off of work. 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 MOB reported having busy schedule and feeling exhausted when she was not working.  MOB verbalized understanding of the hospital drug screen policy, and denied questions or concerns about the policy. She denied substance use during the pregnancy, and denied concerns about infant's UDS and MDS.  MOB denied any barriers to accessing care now as she transitions to the postpartum period since she is on maternity leave.  MOB denied additional questions,  concerns, or needs at this time. She agreed to contact CSW If needs arise.  CSW Plan/Description:   1) CSW to monitor infant's MDS and will notify CPS with any positive drug screens. 2)Patient/Family Education: Hospital drug screen policy, Perinatal mood and anxiety disorders 3)No Further Intervention Required/No Barriers to Discharge    Kelby Fam 08/18/2014, 11:52 AM

## 2014-08-18 NOTE — Progress Notes (Signed)
Post Partum Day 1  Subjective:  Anna Neal is a 34 y.o. [id number] [redacted]w[redacted]d s/p RCS.  No acute events overnight.  Pt still has foley in place so is not able to ambulate. She however denies any pain in legs. Po intake is adequate.  She denies nausea or vomiting.  Pain is well controlled.  She has not had flatus. She has not had bowel movement.  Lochia Small.  Plan for birth control is ?Nexplanon but still undecided.  Method of Feeding: Breast.  Objective: BP 103/70 mmHg  Pulse 70  Temp(Src) 99.3 F (37.4 C) (Oral)  Resp 20  SpO2 95%  LMP 11/30/2013 (Approximate)  Breastfeeding? Unknown  Physical Exam:  General: alert, cooperative and no distress Lochia:normal flow Chest: CTAB Heart: RRR no m/r/g Abdomen: +BS, soft, nontender Uterine Fundus: firm DVT Evaluation: No evidence of DVT seen on physical exam. Extremities: no edema, SCDs in place   Intake/Output Summary (Last 24 hours) at 08/18/14 0748 Last data filed at 08/18/14 0656  Gross per 24 hour  Intake   2149 ml  Output   1315 ml  Net    834 ml    Recent Labs  08/18/14 0605  HGB 7.8*  HCT 26.2*    Assessment/Plan:  ASSESSMENT: Anna Neal is a 34 y.o. A2Z3086 [redacted]w[redacted]d ppd #1 s/p RCS doing well.    Plan for discharge on POD #3 Continue to monitor I/Os and encourage PO hydration Breastfeeding Continued discussions about MOC as patient still unsure.  [place] postpartum care   LOS: 1 day   Caryl Ada, DO 08/18/2014, 7:48 AM PGY-1, [facility]

## 2014-08-19 MED ORDER — OXYCODONE-ACETAMINOPHEN 5-325 MG PO TABS: 1.0000 | ORAL_TABLET | ORAL | Status: DC | PRN

## 2014-08-19 MED ORDER — IBUPROFEN 600 MG PO TABS: 600.0000 mg | ORAL_TABLET | Freq: Four times a day (QID) | ORAL | Status: DC

## 2014-08-19 NOTE — Discharge Summary (Signed)
Obstetric Discharge Summary Reason for Admission: cesarean section Prenatal Procedures: none Intrapartum Procedures: cesarean section Postpartum Procedures: none Complications-Operative and Postpartum: none Operative course uncomplicated  Hospital Course:  Active Problems:   S/P cesarean section   Anna Neal is a 34 y.o. Y2Q8250 s/p RCS for history of previous classical cesarean.  Patient was admitted on 08/17/14.  She has postpartum course that was uncomplicated including no problems with ambulating, PO intake, urination, pain, or bleeding. The pt feels ready to go home and  will be discharged with outpatient follow-up.   Today: No acute events overnight.  Pt denies problems with ambulating, voiding or po intake.  She denies nausea or vomiting.  Pain is well controlled.  She has had flatus. She has had bowel movement.  Lochia Minimal.  Plan for birth control is IUD.  Method of Feeding: breast  Physical Exam:  Filed Vitals:   08/19/14 0635  BP: 116/67  Pulse: 75  Temp: 98.4 F (36.9 C)  Resp: 18    General: alert, cooperative and no distress  Chest: Lungs CTAB CV: RRR no m/r/g Uterine Fundus: firm Incision: no significant drainage DVT Evaluation: No cords or calf tenderness. No significant calf/ankle edema.  H/H: Lab Results  Component Value Date/Time   HGB 7.8* 08/18/2014 06:05 AM   HCT 26.2* 08/18/2014 06:05 AM    Discharge Diagnoses: Term Pregnancy-delivered  Discharge Information: Date: 08/19/2014 Activity: pelvic rest Diet: routine  Medications: Ibuprofen and Percocet Breast feeding:  Yes Condition: stable Instructions: refer to handout Discharge to: home   Discharge Instructions    Activity as tolerated    Complete by:  As directed      Call MD for:  redness, tenderness, or signs of infection (pain, swelling, redness, odor or green/yellow discharge around incision site)    Complete by:  As directed      Call MD for:  severe uncontrolled pain     Complete by:  As directed      Call MD for:  temperature >100.4    Complete by:  As directed      Diet - low sodium heart healthy    Complete by:  As directed      Discharge patient    Complete by:  As directed      Sexual acrtivity    Complete by:  As directed   No sexual activity until after followup            Medication List    STOP taking these medications        ferrous sulfate 325 (65 FE) MG tablet     PNV PRENATAL PLUS MULTIVITAMIN 27-1 MG Tabs      TAKE these medications        ibuprofen 600 MG tablet  Commonly known as:  ADVIL,MOTRIN  Take 1 tablet (600 mg total) by mouth every 6 (six) hours.     oxyCODONE-acetaminophen 5-325 MG per tablet  Commonly known as:  PERCOCET/ROXICET  Take 1 tablet by mouth every 4 (four) hours as needed (for pain scale 4-7).           Follow-up Information    Follow up with [facility] [place].   Why:  for 4-6 week postpartum visit and IUD placement   Contact information:   [address] [place] [address] [phone]      Fabio Asa ,MD OB Fellow 08/19/2014,7:25 AM

## 2014-08-19 NOTE — Discharge Instructions (Signed)
Breastfeeding °Deciding to breastfeed is one of the best choices you can make for you and your baby. A change in hormones during pregnancy causes your breast tissue to grow and increases the number and size of your milk ducts. These hormones also allow proteins, sugars, and fats from your blood supply to make breast milk in your milk-producing glands. Hormones prevent breast milk from being released before your baby is born as well as prompt milk flow after birth. Once breastfeeding has begun, thoughts of your baby, as well as his or her sucking or crying, can stimulate the release of milk from your milk-producing glands.  °BENEFITS OF BREASTFEEDING °For Your Baby °· Your first milk (colostrum) helps your baby's digestive system function better.   °· There are antibodies in your milk that help your baby fight off infections.   °· Your baby has a lower incidence of asthma, allergies, and sudden infant death syndrome.   °· The nutrients in breast milk are better for your baby than infant formulas and are designed uniquely for your baby's needs.   °· Breast milk improves your baby's brain development.   °· Your baby is less likely to develop other conditions, such as childhood obesity, asthma, or type 2 diabetes mellitus.   °For You  °· Breastfeeding helps to create a very special bond between you and your baby.   °· Breastfeeding is convenient. Breast milk is always available at the correct temperature and costs nothing.   °· Breastfeeding helps to burn calories and helps you lose the weight gained during pregnancy.   °· Breastfeeding makes your uterus contract to its prepregnancy size faster and slows bleeding (lochia) after you give birth.   °· Breastfeeding helps to lower your risk of developing type 2 diabetes mellitus, osteoporosis, and breast or ovarian cancer later in life. °SIGNS THAT YOUR BABY IS HUNGRY °Early Signs of Hunger  °· Increased alertness or activity. °· Stretching. °· Movement of the head from  side to side. °· Movement of the head and opening of the mouth when the corner of the mouth or cheek is stroked (rooting). °· Increased sucking sounds, smacking lips, cooing, sighing, or squeaking. °· Hand-to-mouth movements. °· Increased sucking of fingers or hands. °Late Signs of Hunger °· Fussing. °· Intermittent crying. °Extreme Signs of Hunger °Signs of extreme hunger will require calming and consoling before your baby will be able to breastfeed successfully. Do not wait for the following signs of extreme hunger to occur before you initiate breastfeeding:   °· Restlessness. °· A loud, strong cry. °·  Screaming. °BREASTFEEDING BASICS °Breastfeeding Initiation °· Find a comfortable place to sit or lie down, with your neck and back well supported. °· Place a pillow or rolled up blanket under your baby to bring him or her to the level of your breast (if you are seated). Nursing pillows are specially designed to help support your arms and your baby while you breastfeed. °· Make sure that your baby's abdomen is facing your abdomen.   °· Gently massage your breast. With your fingertips, massage from your chest wall toward your nipple in a circular motion. This encourages milk flow. You may need to continue this action during the feeding if your milk flows slowly. °· Support your breast with 4 fingers underneath and your thumb above your nipple. Make sure your fingers are well away from your nipple and your baby's mouth.   °· Stroke your baby's lips gently with your finger or nipple.   °· When your baby's mouth is open wide enough, quickly bring your baby to your   breast, placing your entire nipple and as much of the colored area around your nipple (areola) as possible into your baby's mouth.   °¨ More areola should be visible above your baby's upper lip than below the lower lip.   °¨ Your baby's tongue should be between his or her lower gum and your breast.   °· Ensure that your baby's mouth is correctly positioned  around your nipple (latched). Your baby's lips should create a seal on your breast and be turned out (everted). °· It is common for your baby to suck about 2-3 minutes in order to start the flow of breast milk. °Latching °Teaching your baby how to latch on to your breast properly is very important. An improper latch can cause nipple pain and decreased milk supply for you and poor weight gain in your baby. Also, if your baby is not latched onto your nipple properly, he or she may swallow some air during feeding. This can make your baby fussy. Burping your baby when you switch breasts during the feeding can help to get rid of the air. However, teaching your baby to latch on properly is still the best way to prevent fussiness from swallowing air while breastfeeding. °Signs that your baby has successfully latched on to your nipple:    °· Silent tugging or silent sucking, without causing you pain.   °· Swallowing heard between every 3-4 sucks.   °·  Muscle movement above and in front of his or her ears while sucking.   °Signs that your baby has not successfully latched on to nipple:  °· Sucking sounds or smacking sounds from your baby while breastfeeding. °· Nipple pain. °If you think your baby has not latched on correctly, slip your finger into the corner of your baby's mouth to break the suction and place it between your baby's gums. Attempt breastfeeding initiation again. °Signs of Successful Breastfeeding °Signs from your baby:   °· A gradual decrease in the number of sucks or complete cessation of sucking.   °· Falling asleep.   °· Relaxation of his or her body.   °· Retention of a small amount of milk in his or her mouth.   °· Letting go of your breast by himself or herself. °Signs from you: °· Breasts that have increased in firmness, weight, and size 1-3 hours after feeding.   °· Breasts that are softer immediately after breastfeeding. °· Increased milk volume, as well as a change in milk consistency and color by  the fifth day of breastfeeding.   °· Nipples that are not sore, cracked, or bleeding. °Signs That Your Baby is Getting Enough Milk °· Wetting at least 3 diapers in a 24-hour period. The urine should be clear and pale yellow by age 5 days. °· At least 3 stools in a 24-hour period by age 5 days. The stool should be soft and yellow. °· At least 3 stools in a 24-hour period by age 7 days. The stool should be seedy and yellow. °· No loss of weight greater than 10% of birth weight during the first 3 days of age. °· Average weight gain of 4-7 ounces (113-198 g) per week after age 4 days. °· Consistent daily weight gain by age 5 days, without weight loss after the age of 2 weeks. °After a feeding, your baby may spit up a small amount. This is common. °BREASTFEEDING FREQUENCY AND DURATION °Frequent feeding will help you make more milk and can prevent sore nipples and breast engorgement. Breastfeed when you feel the need to reduce the fullness of your breasts   or when your baby shows signs of hunger. This is called "breastfeeding on demand." Avoid introducing a pacifier to your baby while you are working to establish breastfeeding (the first 4-6 weeks after your baby is born). After this time you may choose to use a pacifier. Research has shown that pacifier use during the first year of a baby's life decreases the risk of sudden infant death syndrome (SIDS). °Allow your baby to feed on each breast as long as he or she wants. Breastfeed until your baby is finished feeding. When your baby unlatches or falls asleep while feeding from the first breast, offer the second breast. Because newborns are often sleepy in the first few weeks of life, you may need to awaken your baby to get him or her to feed. °Breastfeeding times will vary from baby to baby. However, the following rules can serve as a guide to help you ensure that your baby is properly fed: °· Newborns (babies 4 weeks of age or younger) may breastfeed every 1-3  hours. °· Newborns should not go longer than 3 hours during the day or 5 hours during the night without breastfeeding. °· You should breastfeed your baby a minimum of 8 times in a 24-hour period until you begin to introduce solid foods to your baby at around 6 months of age. °BREAST MILK PUMPING °Pumping and storing breast milk allows you to ensure that your baby is exclusively fed your breast milk, even at times when you are unable to breastfeed. This is especially important if you are going back to work while you are still breastfeeding or when you are not able to be present during feedings. Your lactation consultant can give you guidelines on how long it is safe to store breast milk.  °A breast pump is a machine that allows you to pump milk from your breast into a sterile bottle. The pumped breast milk can then be stored in a refrigerator or freezer. Some breast pumps are operated by hand, while others use electricity. Ask your lactation consultant which type will work best for you. Breast pumps can be purchased, but some hospitals and breastfeeding support groups lease breast pumps on a monthly basis. A lactation consultant can teach you how to hand express breast milk, if you prefer not to use a pump.  °CARING FOR YOUR BREASTS WHILE YOU BREASTFEED °Nipples can become dry, cracked, and sore while breastfeeding. The following recommendations can help keep your breasts moisturized and healthy: °· Avoid using soap on your nipples.   °· Wear a supportive bra. Although not required, special nursing bras and tank tops are designed to allow access to your breasts for breastfeeding without taking off your entire bra or top. Avoid wearing underwire-style bras or extremely tight bras. °· Air dry your nipples for 3-4 minutes after each feeding.   °· Use only cotton bra pads to absorb leaked breast milk. Leaking of breast milk between feedings is normal.   °· Use lanolin on your nipples after breastfeeding. Lanolin helps to  maintain your skin's normal moisture barrier. If you use pure lanolin, you do not need to wash it off before feeding your baby again. Pure lanolin is not toxic to your baby. You may also hand express a few drops of breast milk and gently massage that milk into your nipples and allow the milk to air dry. °In the first few weeks after giving birth, some women experience extremely full breasts (engorgement). Engorgement can make your breasts feel heavy, warm, and tender to the   touch. Engorgement peaks within 3-5 days after you give birth. The following recommendations can help ease engorgement: °· Completely empty your breasts while breastfeeding or pumping. You may want to start by applying warm, moist heat (in the shower or with warm water-soaked hand towels) just before feeding or pumping. This increases circulation and helps the milk flow. If your baby does not completely empty your breasts while breastfeeding, pump any extra milk after he or she is finished. °· Wear a snug bra (nursing or regular) or tank top for 1-2 days to signal your body to slightly decrease milk production. °· Apply ice packs to your breasts, unless this is too uncomfortable for you. °· Make sure that your baby is latched on and positioned properly while breastfeeding. °If engorgement persists after 48 hours of following these recommendations, contact your health care provider or a lactation consultant. °OVERALL HEALTH CARE RECOMMENDATIONS WHILE BREASTFEEDING °· Eat healthy foods. Alternate between meals and snacks, eating 3 of each per day. Because what you eat affects your breast milk, some of the foods may make your baby more irritable than usual. Avoid eating these foods if you are sure that they are negatively affecting your baby. °· Drink milk, fruit juice, and water to satisfy your thirst (about 10 glasses a day).   °· Rest often, relax, and continue to take your prenatal vitamins to prevent fatigue, stress, and anemia. °· Continue  breast self-awareness checks. °· Avoid chewing and smoking tobacco. °· Avoid alcohol and drug use. °Some medicines that may be harmful to your baby can pass through breast milk. It is important to ask your health care provider before taking any medicine, including all over-the-counter and prescription medicine as well as vitamin and herbal supplements. °It is possible to become pregnant while breastfeeding. If birth control is desired, ask your health care provider about options that will be safe for your baby. °SEEK MEDICAL CARE IF:  °· You feel like you want to stop breastfeeding or have become frustrated with breastfeeding. °· You have painful breasts or nipples. °· Your nipples are cracked or bleeding. °· Your breasts are red, tender, or warm. °· You have a swollen area on either breast. °· You have a fever or chills. °· You have nausea or vomiting. °· You have drainage other than breast milk from your nipples. °· Your breasts do not become full before feedings by the fifth day after you give birth. °· You feel sad and depressed. °· Your baby is too sleepy to eat well. °· Your baby is having trouble sleeping.   °· Your baby is wetting less than 3 diapers in a 24-hour period. °· Your baby has less than 3 stools in a 24-hour period. °· Your baby's skin or the white part of his or her eyes becomes yellow.   °· Your baby is not gaining weight by 5 days of age. °SEEK IMMEDIATE MEDICAL CARE IF:  °· Your baby is overly tired (lethargic) and does not want to wake up and feed. °· Your baby develops an unexplained fever. °Document Released: 02/13/2005 Document Revised: 02/18/2013 Document Reviewed: 08/07/2012 °[employer]® Patient Information ©2015 [employer]. This information is not intended to replace advice given to you by your health care provider. Make sure you discuss any questions you have with your health care provider. °Cesarean Delivery, Care After °Refer to this sheet in the next few weeks. These instructions  provide you with information on caring for yourself after your procedure. Your health care provider may also give you   specific instructions. Your treatment has been planned according to current medical practices, but problems sometimes occur. Call your health care provider if you have any problems or questions after you go home. °HOME CARE INSTRUCTIONS  °· Only take over-the-counter or prescription medications as directed by your health care provider. °· Do not drink alcohol, especially if you are breastfeeding or taking medication to relieve pain. °· Do not chew or smoke tobacco. °· Continue to use good perineal care. Good perineal care includes: °· Wiping your perineum from front to back. °· Keeping your perineum clean. °· Check your surgical cut (incision) daily for increased redness, drainage, swelling, or separation of skin. °· Clean your incision gently with soap and water every day, and then pat it dry. If your health care provider says it is okay, leave the incision uncovered. Use a bandage (dressing) if the incision is draining fluid or appears irritated. If the adhesive strips across the incision do not fall off within 7 days, carefully peel them off. °· Hug a pillow when coughing or sneezing until your incision is healed. This helps to relieve pain. °· Do not use tampons or douche until your health care provider says it is okay. °· Shower, wash your hair, and take tub baths as directed by your health care provider. °· Wear a well-fitting bra that provides breast support. °· Limit wearing support panties or control-top hose. °· Drink enough fluids to keep your urine clear or pale yellow. °· Eat high-fiber foods such as whole grain cereals and breads, brown rice, beans, and fresh fruits and vegetables every day. These foods may help prevent or relieve constipation. °· Resume activities such as climbing stairs, driving, lifting, exercising, or traveling as directed by your health care provider. °· Talk to  your health care provider about resuming sexual activities. This is dependent upon your risk of infection, your rate of healing, and your comfort and desire to resume sexual activity. °· Try to have someone help you with your household activities and your newborn for at least a few days after you leave the hospital. °· Rest as much as possible. Try to rest or take a nap when your newborn is sleeping. °· Increase your activities gradually. °· Keep all of your scheduled postpartum appointments. It is very important to keep your scheduled follow-up appointments. At these appointments, your health care provider will be checking to make sure that you are healing physically and emotionally. °SEEK MEDICAL CARE IF:  °· You are passing large clots from your vagina. Save any clots to show your health care provider. °· You have a foul smelling discharge from your vagina. °· You have trouble urinating. °· You are urinating frequently. °· You have pain when you urinate. °· You have a change in your bowel movements. °· You have increasing redness, pain, or swelling near your incision. °· You have pus draining from your incision. °· Your incision is separating. °· You have painful, hard, or reddened breasts. °· You have a severe headache. °· You have blurred vision or see spots. °· You feel sad or depressed. °· You have thoughts of hurting yourself or your newborn. °· You have questions about your care, the care of your newborn, or medications. °· You are dizzy or light-headed. °· You have a rash. °· You have pain, redness, or swelling at the site of the removed intravenous access (IV) tube. °· You have nausea or vomiting. °· You stopped breastfeeding and have not had a menstrual period within 12   weeks of stopping. °· You are not breastfeeding and have not had a menstrual period within 12 weeks of delivery. °· You have a fever. °SEEK IMMEDIATE MEDICAL CARE IF: °· You have persistent pain. °· You have chest pain. °· You have  shortness of breath. °· You faint. °· You have leg pain. °· You have stomach pain. °· Your vaginal bleeding saturates 2 or more sanitary pads in 1 hour. °MAKE SURE YOU:  °· Understand these instructions. °· Will watch your condition. °· Will get help right away if you are not doing well or get worse. °Document Released: 11/05/2001 Document Revised: 06/30/2013 Document Reviewed: 10/11/2011 °[employer]® Patient Information ©2015 [employer]. This information is not intended to replace advice given to you by your health care provider. Make sure you discuss any questions you have with your health care provider. ° °

## 2014-08-20 ENCOUNTER — Ambulatory Visit: Payer: Self-pay

## 2014-08-20 NOTE — Lactation Note (Signed)
This note was copied from the chart of Anna Othella Shorts. Lactation Consultation Note  Patient Name: Anna Neal [id number] Date: 08/20/2014 Reason for consult: Follow-up assessment;Infant weight loss (per mom ready for D/C , has a F/U weight check tomorrow )  Baby is 93 hours old at 7% weight, loss 6-0.3 oz . Baby has had 6 voids ( 15 in life ), 2 stools last 24 hours, ( 6 life ), @ 27 hours old Bili - 8.7. Breast fed 10 -35 mins x 8 in the last 24 hours. Supplemented x1 with 20 ml of formula. LC recommended skin to skin feedings until baby can stay awake for feeding, prior to latch - breast massage , hand express, and prepump To prime milk ducts and breast compressions with latch until swallows and then intermittent with feeding. If the weight isn't increasing or or the same to call [facility] for a DEBP loaner. Mom already has a kit.  Mom denies sore ness, and LC reviewed sore nipple and engorgement prevention and tx.  Mother informed of post-discharge support and given phone number to the lactation department, including services for phone call assistance;  out-patient appointments; and breastfeeding support group. List of other breastfeeding resources in the community given in the handout. Encouraged  mother to call for problems or concerns related to breastfeeding.   Maternal Data    Feeding Feeding Type: Breast Fed Length of feed: 20 min  LATCH Score/Interventions Latch: Grasps breast easily, tongue down, lips flanged, rhythmical sucking.  Audible Swallowing: Spontaneous and intermittent Intervention(s): Skin to skin Intervention(s): Skin to skin  Type of Nipple: Everted at rest and after stimulation Intervention(s): No intervention needed  Comfort (Breast/Nipple): Soft / non-tender     Hold (Positioning): No assistance needed to correctly position infant at breast.  LATCH Score: 10  Lactation Tools Discussed/Used     Consult Status Consult Status:  Complete Date: 08/20/14    Myer Haff 08/20/2014, 9:49 AM

## 2015-06-24 IMAGING — US US OB COMP +14 WK
2 series · 12 of 28 positions shown · non-contrast
Comparison: none

[Series 1: us ob comp +14 wk · 60 acquisitions, 11 frames shown (1 of 2)]
[im 3/60]
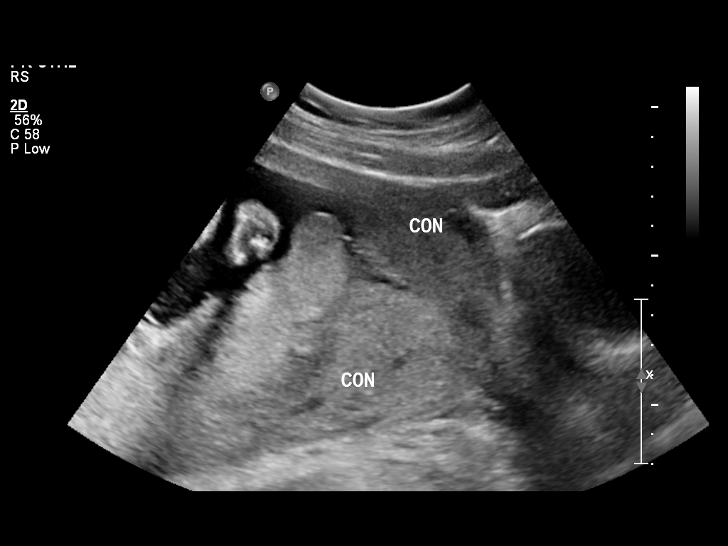
[im 8/60]
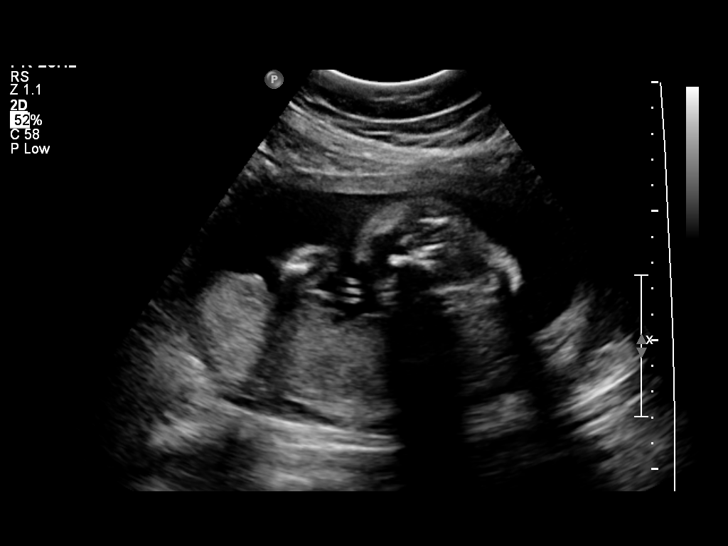
[im 13/60]
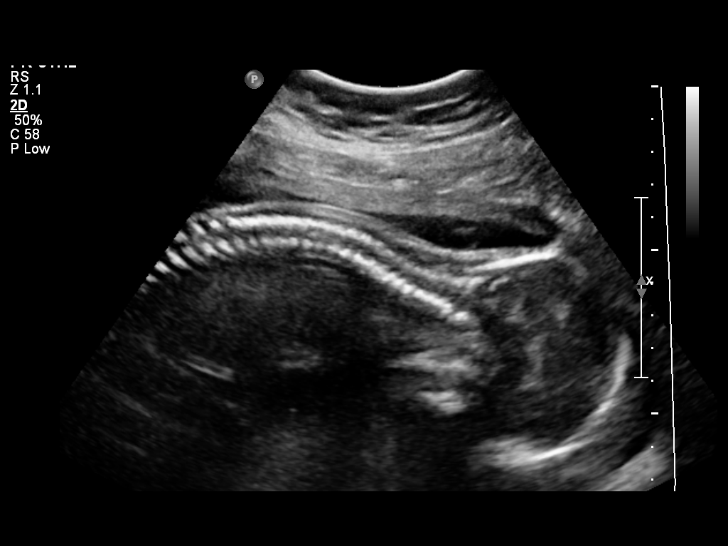
[im 20/60]
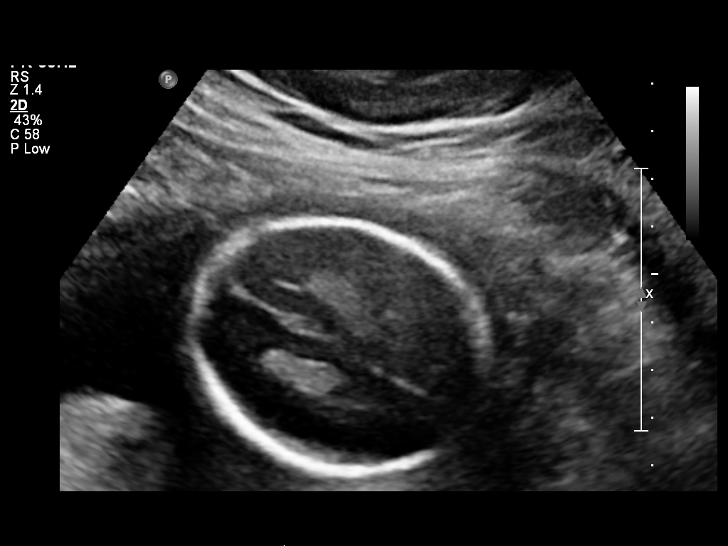
[im 25/60]
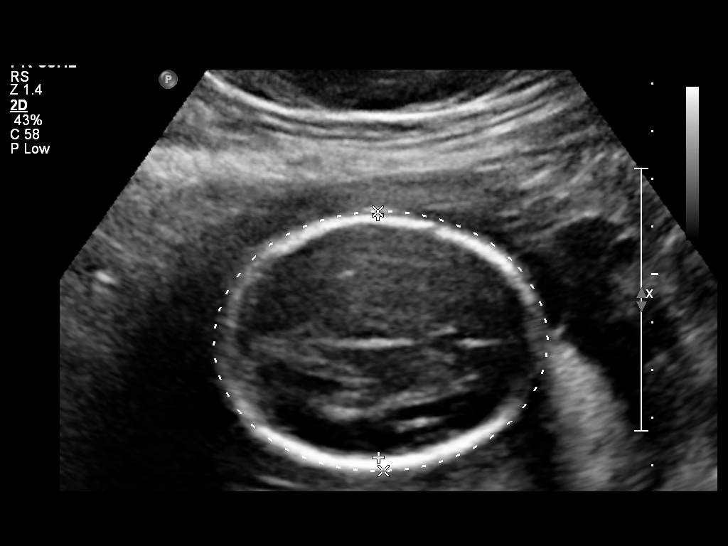
[im 30/60]
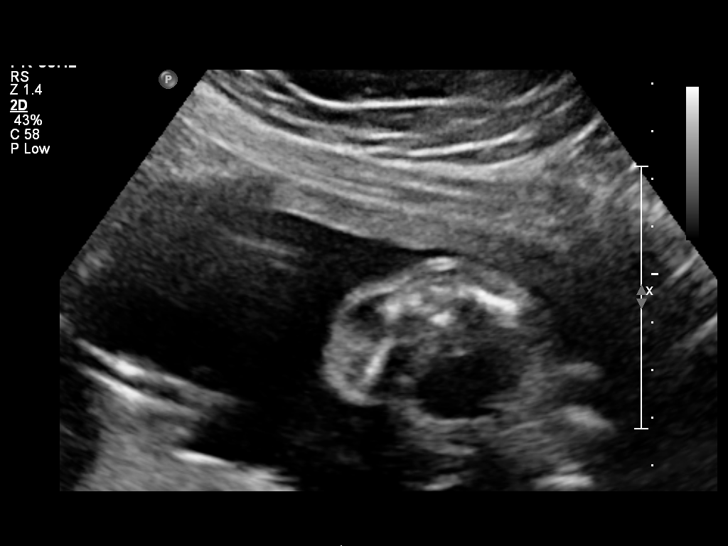
[im 37/60]
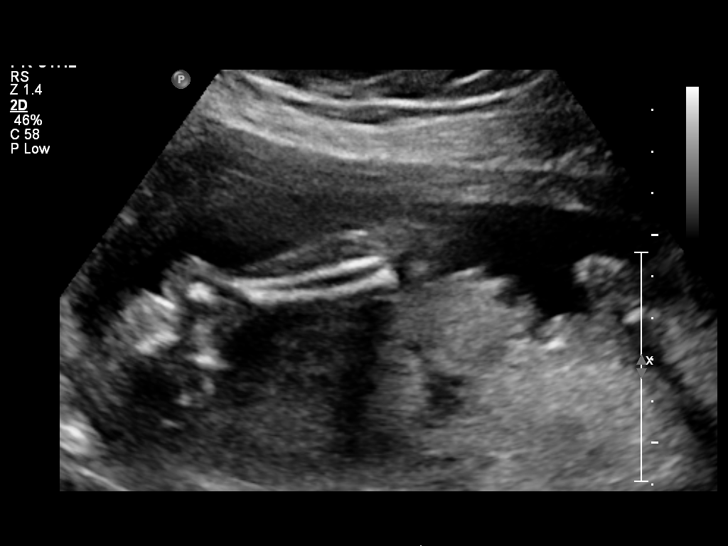
[im 42/60]
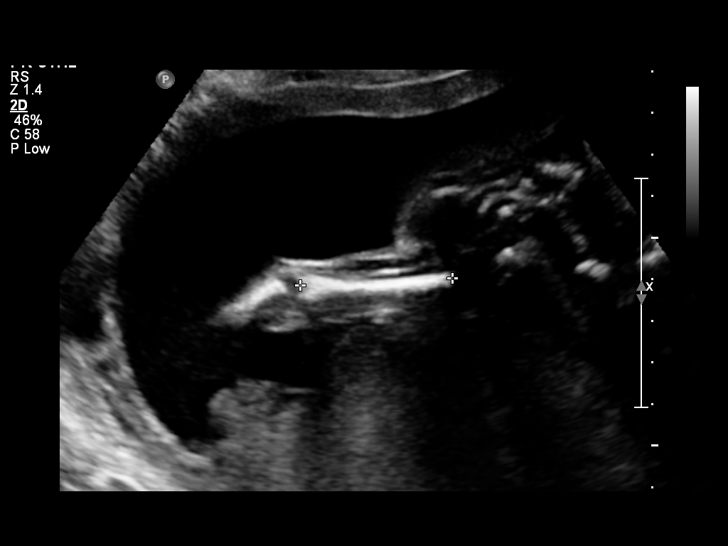
[im 47/60]
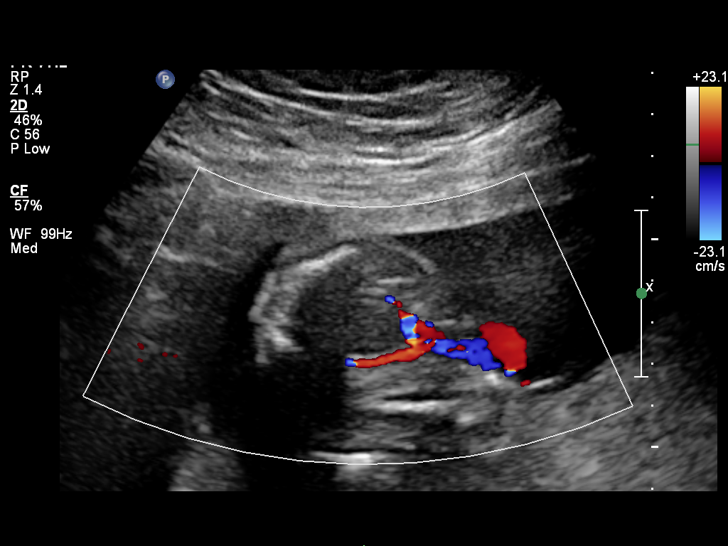
[im 55/60]
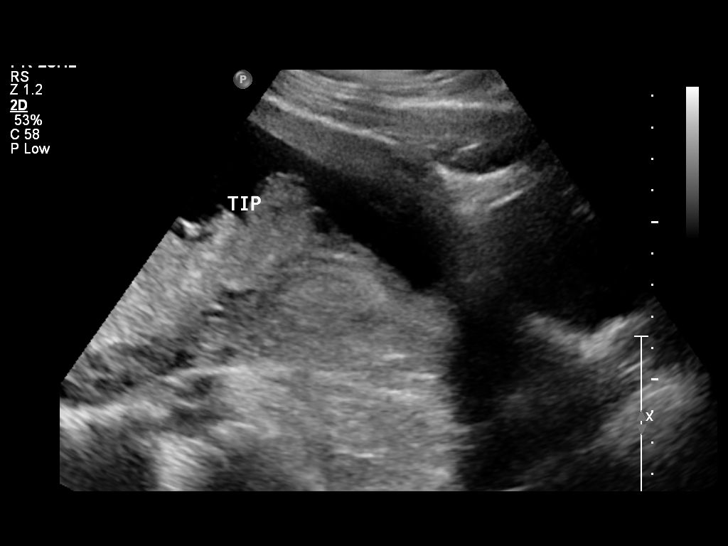
[im 60/60]
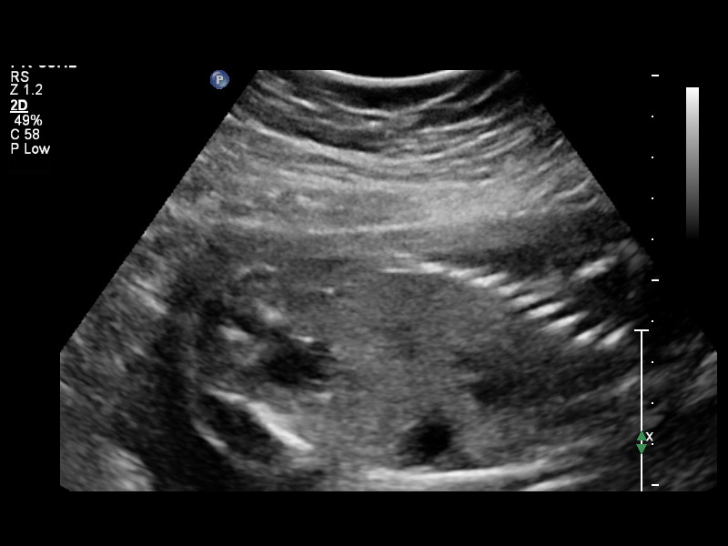

[Series 1: us ob comp +14 wk · 1 of 6 slices shown (2 of 2)]
[im 3/6]
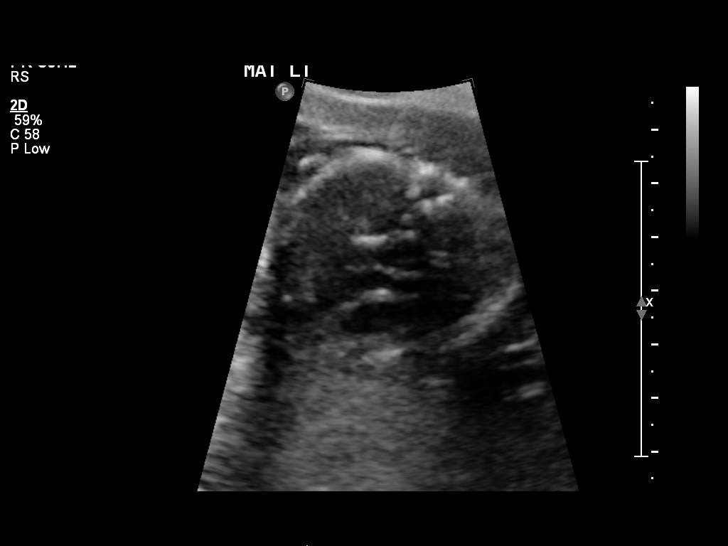

[12 of 28 positions shown; findings below may reference images not displayed]

OBSTETRICS REPORT
                      (Signed Final 03/03/2013 [DATE])

 Name:       ZAN LUVIANO              Visit Date: 03/03/2013 [DATE]

Service(s) Provided

 US OB COMP + 14 WK                                    76805.1
Indications

 Basic anatomic survey
Fetal Evaluation

 Num Of Fetuses:    1
 Fetal Heart Rate:  150                          bpm
 Cardiac Activity:  Observed
 Presentation:      Transverse, head to
                    maternal left
 Placenta:          Posterior, above cervical
                    os
 P. Cord            Visualized, central
 Insertion:

 Amniotic Fluid
 AFI FV:      Subjectively within normal limits
                                             Larg Pckt:     4.2  cm
Biometry

 BPD:     51.2  mm     G. Age:  21w 4d                CI:        69.29   70 - 86
                                                      FL/HC:      20.1   19.2 -

 HC:     196.4  mm     G. Age:  21w 6d        7  %    HC/AC:      1.14   1.05 -

 AC:     172.6  mm     G. Age:  22w 1d       23  %    FL/BPD:     77.1   71 - 87
 FL:      39.5  mm     G. Age:  22w 5d       35  %    FL/AC:      22.9   20 - 24
 HUM:     36.8  mm     G. Age:  22w 6d       43  %

 Est. FW:     496  gm      1 lb 1 oz     40  %
Gestational Age

 Clinical EDD:  22w 6d                                        EDD:   07/01/13
 U/S Today:     22w 0d                                        EDD:   07/07/13
 Best:          22w 6d     Det. By:  Clinical EDD             EDD:   07/01/13
Anatomy

 Cranium:          Appears normal         Aortic Arch:      Appears normal
 Fetal Cavum:      Appears normal         Ductal Arch:      Not well visualized
 Ventricles:       Appears normal         Diaphragm:        Appears normal
 Choroid Plexus:   Appears normal         Stomach:          Appears normal, left
                                                            sided
 Cerebellum:       Appears normal         Abdomen:          Appears normal
 Posterior Fossa:  Appears normal         Abdominal Wall:   Appears nml (cord
                                                            insert, abd wall)
 Nuchal Fold:      Not applicable (>20    Cord Vessels:     Appears normal (3
                   wks GA)                                  vessel cord)
 Face:             Not well visualized    Kidneys:          Appear normal
 Lips:             Not well visualized    Bladder:          Appears normal
 Heart:            Not well visualized    Spine:            Appears normal
 RVOT:             Not well visualized    Lower             Appears normal
                                          Extremities:
 LVOT:             Appears normal         Upper             Appears normal
                                          Extremities:

 Other:  Female gender. Technically difficult due to maternal habitus and fetal
         position. Complete fetal anatomic survey previously performed.
Cervix Uterus Adnexa

 Cervical Length:    4.5      cm

 Cervix:       Normal appearance by transabdominal scan.
 Uterus:       No abnormality visualized.

 Left Ovary:    No adnexal mass visualized.
 Right Ovary:   Simple cyst, measuring 5.4 x 4.7 x 6cm
 Adnexa:     No abnormality visualized.
Impression

 Active SIUP at 00w7d
 EFW 40th percentile
 No dysmorphic features seen
 Limitations as documented above
 No previa
Recommendations

 Follow up attempt to complete fetal survey in 6 weeks.

## 2020-03-09 ENCOUNTER — Other Ambulatory Visit: Payer: Medicaid Other

## 2020-03-09 DIAGNOSIS — Z20822 Contact with and (suspected) exposure to covid-19: Secondary | ICD-10-CM

## 2020-03-11 LAB — NOVEL CORONAVIRUS, NAA: SARS-CoV-2, NAA: NOT DETECTED

## 2020-03-11 LAB — SARS-COV-2, NAA 2 DAY TAT

## 2020-11-30 ENCOUNTER — Ambulatory Visit (INDEPENDENT_AMBULATORY_CARE_PROVIDER_SITE_OTHER): Payer: Medicaid Other

## 2020-11-30 ENCOUNTER — Encounter (HOSPITAL_COMMUNITY): Payer: Self-pay

## 2020-11-30 ENCOUNTER — Ambulatory Visit (HOSPITAL_COMMUNITY)
Admission: EM | Admit: 2020-11-30 | Discharge: 2020-11-30 | Disposition: A | Discharge status: Home / Self Care | Payer: Medicaid Other | Attending: Family Medicine | Admitting: Family Medicine

## 2020-11-30 ENCOUNTER — Other Ambulatory Visit: Payer: Self-pay

## 2020-11-30 DIAGNOSIS — S93431A Sprain of tibiofibular ligament of right ankle, initial encounter: Secondary | ICD-10-CM

## 2020-11-30 DIAGNOSIS — S93491A Sprain of other ligament of right ankle, initial encounter: Secondary | ICD-10-CM

## 2020-11-30 NOTE — ED Triage Notes (Signed)
Pt states stepped off a porch wrong twisting rt ankle this morning. Pain to bare weight. States used ice with no relief in swelling or pain.

## 2020-12-01 NOTE — ED Provider Notes (Signed)
[facility]   [id number] 11/30/20 Arrival Time: 1534  ASSESSMENT & PLAN:  1. Sprain of anterior talofibular ligament of right ankle, initial encounter     I have personally viewed the imaging studies ordered this visit. No fracture appreciated.  Discussed RICE instructions.  Orders Placed This Encounter  Procedures   DG Ankle Complete Right    Recommend:  Follow-up Information     [facility].   Why: If worsening or failing to improve as anticipated. Contact information: [address] [place] [address] [phone]                Reviewed expectations re: course of current medical issues. Questions answered. Outlined signs and symptoms indicating need for more acute intervention. Patient verbalized understanding. After Visit Summary given.  SUBJECTIVE: History from: patient. Michell A Klingbeil is a 40 y.o. female who reports R ankle pain; inversion injury; today. Able to bear wt but with discomfort; laterally. Iced ankle without much help. No extremity sensation changes or weakness.    Past Surgical History:  Procedure Laterality Date   CESAREAN SECTION N/A 07/09/2013   Procedure: CESAREAN SECTION;  Surgeon: Brock Bad, MD;  Location: [facility];  Service: Obstetrics;  Laterality: N/A;   CESAREAN SECTION WITH BILATERAL TUBAL LIGATION Bilateral 08/17/2014   Procedure: CESAREAN SECTION REPEAT WITH BILATERAL TUBAL LIGATION;  Surgeon: Catalina Antigua, MD;  Location: [facility];  Service: Obstetrics;  Laterality: Bilateral;  previous classic uterine incision. 36 week.      OBJECTIVE:  Vitals:   11/30/20 1652  BP: 113/79  Pulse: 90  Resp: 18  Temp: 98.8 F (37.1 C)  TempSrc: Oral  SpO2: 98%    General appearance: alert; no distress HEENT: [place]; AT Neck: supple with FROM Resp: unlabored respirations Extremities: RLE: warm with well perfused appearance; fairly well localized marked tenderness  over right lateral ankle; without gross deformities; swelling: minimal; bruising: none; ankle ROM: normal, with discomfort CV: brisk extremity capillary refill of RLE; 2+ DP pulse of RLE. Skin: warm and dry; no visible rashes Neurologic: normal sensation and strength of RLE Psychological: alert and cooperative; normal mood and affect  Imaging: DG Ankle Complete Right  Result Date: 11/30/2020 CLINICAL DATA:  Injury. Twisted right ankle stepping off of a porch. Pain with weight-bearing. Swelling. EXAM: RIGHT ANKLE - COMPLETE 3+ VIEW COMPARISON:  None. FINDINGS: No acute fracture or dislocation is identified. Moderate soft tissue swelling is greatest about the lateral and anterior aspects of the ankle. A small well corticated ossicle lateral to the talus and inferior to the lateral malleolus is chronic in appearance. There are posterior and plantar calcaneal enthesophytes. IMPRESSION: Soft tissue swelling without evidence of acute osseous abnormality. Electronically Signed   By: Sebastian Ache M.D.   On: 11/30/2020 17:55      No Known Allergies  Past Medical History:  Diagnosis Date   Abnormal Pap smear    Anemia    Bronchitis    Heartburn during pregnancy    HPV in female    Pregnancy induced hypertension    with previous pregnancy   Vertigo    Social History   Socioeconomic History   Marital status: Married    Spouse name: Not on file   Number of children: Not on file   Years of education: Not on file   Highest education level: Not on file  Occupational History   Not on file  Tobacco Use   Smoking status:  Never   Smokeless tobacco: Never  Substance and Sexual Activity   Alcohol use: No   Drug use: No   Sexual activity: Not Currently    Birth control/protection: None  Other Topics Concern   Not on file  Social History Narrative   Not on file   Social Determinants of Health   Financial Resource Strain: Not on file  Food Insecurity: Not on file  Transportation Needs:  Not on file  Physical Activity: Not on file  Stress: Not on file  Social Connections: Not on file   Family History  Problem Relation Age of Onset   Stroke Father    Asthma Maternal Grandmother    Alzheimer's disease Maternal Grandmother    Cancer Maternal Grandfather        throat and lung   Past Surgical History:  Procedure Laterality Date   CESAREAN SECTION N/A 07/09/2013   Procedure: CESAREAN SECTION;  Surgeon: Brock Bad, MD;  Location: [facility];  Service: Obstetrics;  Laterality: N/A;   CESAREAN SECTION WITH BILATERAL TUBAL LIGATION Bilateral 08/17/2014   Procedure: CESAREAN SECTION REPEAT WITH BILATERAL TUBAL LIGATION;  Surgeon: Catalina Antigua, MD;  Location: [facility];  Service: Obstetrics;  Laterality: Bilateral;  previous classic uterine incision. 36 week.       Mardella Layman, MD 12/01/20 [phone]

## 2022-08-06 ENCOUNTER — Ambulatory Visit (HOSPITAL_COMMUNITY)
Admission: EM | Admit: 2022-08-06 | Discharge: 2022-08-06 | Disposition: A | Discharge status: Home / Self Care | Payer: Medicaid Other | Attending: Emergency Medicine | Admitting: Emergency Medicine

## 2022-08-06 ENCOUNTER — Encounter (HOSPITAL_COMMUNITY): Payer: Self-pay | Admitting: Emergency Medicine

## 2022-08-06 DIAGNOSIS — H5789 Other specified disorders of eye and adnexa: Secondary | ICD-10-CM

## 2022-08-06 MED ORDER — PREDNISONE 20 MG PO TABS: 40.0000 mg | ORAL_TABLET | Freq: Every day | ORAL | 0 refills | Status: AC

## 2022-08-06 MED ORDER — METHYLPREDNISOLONE ACETATE 80 MG/ML IJ SUSP
INTRAMUSCULAR | Status: AC
  Filled 2022-08-06: qty 1

## 2022-08-06 MED ORDER — METHYLPREDNISOLONE ACETATE 80 MG/ML IJ SUSP
80.0000 mg | Freq: Once | INTRAMUSCULAR | Status: AC
  Administered 2022-08-06: 80 mg via INTRAMUSCULAR

## 2022-08-06 NOTE — ED Triage Notes (Signed)
Pt got stung twice in face and once on arm on Friday. Reports some swelling to left eye yesterday but today when woke up can barely open eye due to swelling.

## 2022-08-06 NOTE — Discharge Instructions (Addendum)
You are being evaluated for your left eye swelling which is possibly related to recent bee stings  You have been given an injection of steroids today in the office today to help reduce the inflammatory process that occurs with this rash which will help minimize your itching as well as begin to clear  Starting tomorrow take prednisone every morning with food as directed, to continue the above process  You may follow-up with his urgent care as needed if symptoms persist or worsen or you begin to have new symptoms such as redness, visual changes or drainage please return for reevaluation

## 2022-08-06 NOTE — ED Provider Notes (Signed)
[facility]    CSN: [id number] Arrival date & time: 08/06/22  1006      History   Chief Complaint Chief Complaint  Patient presents with   Facial Swelling   Insect Bite    HPI Emaley A Mizzi is a 42 y.o. female.   Patient presents for evaluation of left eye swelling beginning 1 day ago.  Symptoms worsening this morning, barely open the eye but able to see clearly.  Endorses that 2 days ago she was stung in the face twice and on the arm once by a bee.  Has had a bee sting before without complication.  Denies respiratory symptoms.  Denies pruritus, drainage, redness or pain to the eye.  Past Medical History:  Diagnosis Date   Abnormal Pap smear    Anemia    Bronchitis    Heartburn during pregnancy    HPV in female    Pregnancy induced hypertension    with previous pregnancy   Vertigo     Patient Active Problem List   Diagnosis Date Noted   S/P cesarean section 08/17/2014   Evaluate anatomy not seen on prior sonogram    Encounter for fetal anatomic survey    Gestational hypertension    Cesarean delivery delivered 07/09/2013   Normal labor 07/07/2013   Trichimoniasis 02/18/2013   Supervision of other normal pregnancy 12/18/2012   Obesity complicating pregnancy 12/18/2012    Past Surgical History:  Procedure Laterality Date   CESAREAN SECTION N/A 07/09/2013   Procedure: CESAREAN SECTION;  Surgeon: Brock Bad, MD;  Location: [facility];  Service: Obstetrics;  Laterality: N/A;   CESAREAN SECTION WITH BILATERAL TUBAL LIGATION Bilateral 08/17/2014   Procedure: CESAREAN SECTION REPEAT WITH BILATERAL TUBAL LIGATION;  Surgeon: Catalina Antigua, MD;  Location: [facility];  Service: Obstetrics;  Laterality: Bilateral;  previous classic uterine incision. 36 week.    OB History     Gravida  4   Para  4   Term  3   Preterm  1   AB      Living  4      SAB      IAB      Ectopic      Multiple  0   Live Births  4            Home Medications     Prior to Admission medications   Medication Sig Start Date End Date Taking? Authorizing Provider  predniSONE (DELTASONE) 20 MG tablet Take 2 tablets (40 mg total) by mouth daily. 08/06/22  Yes Valinda Hoar, NP    Family History Family History  Problem Relation Age of Onset   Stroke Father    Asthma Maternal Grandmother    Alzheimer's disease Maternal Grandmother    Cancer Maternal Grandfather        throat and lung    Social History Social History   Tobacco Use   Smoking status: Never   Smokeless tobacco: Never  Substance Use Topics   Alcohol use: No   Drug use: No     Allergies   Patient has no known allergies.   Review of Systems Review of Systems   Physical Exam Triage Vital Signs ED Triage Vitals [08/06/22 1034]  Enc Vitals Group     BP 125/72     Pulse Rate 71     Resp 18     Temp 98 F (36.7 C)     Temp src  SpO2 98 %     Weight      Height      Head Circumference      Peak Flow      Pain Score 0     Pain Loc      Pain Edu?      Excl. in GC?    No data found.  Updated Vital Signs BP 125/72 (BP Location: Right Arm)   Pulse 71   Temp 98 F (36.7 C)   Resp 18   LMP 07/18/2022   SpO2 98%   Visual Acuity Right Eye Distance:   Left Eye Distance:   Bilateral Distance:    Right Eye Near:   Left Eye Near:    Bilateral Near:     Physical Exam Constitutional:      Appearance: Normal appearance.  Eyes:     Comments: Severe left eye periorbital swelling without erythema, drainage, vision is grossly intact, extraocular movements intact  Pulmonary:     Effort: Pulmonary effort is normal.  Neurological:     Mental Status: She is alert and oriented to person, place, and time. Mental status is at baseline.      UC Treatments / Results  Labs (all labs ordered are listed, but only abnormal results are displayed) Labs Reviewed - No data to display  EKG   Radiology No results found.  Procedures Procedures (including  critical care time)  Medications Ordered in UC Medications  methylPREDNISolone acetate (DEPO-MEDROL) injection 80 mg (has no administration in time range)    Initial Impression / Assessment and Plan / UC Course  I have reviewed the triage vital signs and the nursing notes.  Pertinent labs & imaging results that were available during my care of the patient were reviewed by me and considered in my medical decision making (see chart for details).  Left eye swelling  Will move forward with inflammatory process, currently no signs of infection, methylprednisolone injection given in office and prescribed prednisone burst for outpatient use, given strict precautions for any signs of infection to return for reevaluation Final Clinical Impressions(s) / UC Diagnoses   Final diagnoses:  Eye swelling, left     Discharge Instructions      You are being evaluated for your left eye swelling which is possibly related to recent bee stings  You have been given an injection of steroids today in the office today to help reduce the inflammatory process that occurs with this rash which will help minimize your itching as well as begin to clear  Starting tomorrow take prednisone every morning with food as directed, to continue the above process  You may follow-up with his urgent care as needed if symptoms persist or worsen or you begin to have new symptoms such as redness, visual changes or drainage please return for reevaluation   ED Prescriptions     Medication Sig Dispense Auth. Provider   predniSONE (DELTASONE) 20 MG tablet Take 2 tablets (40 mg total) by mouth daily. 10 tablet Valinda Hoar, NP      PDMP not reviewed this encounter.   Valinda Hoar, NP 08/06/22 1114

## 2023-04-14 ENCOUNTER — Ambulatory Visit (HOSPITAL_COMMUNITY)
Admission: EM | Admit: 2023-04-14 | Discharge: 2023-04-14 | Disposition: A | Discharge status: Home / Self Care | Payer: Medicaid Other | Attending: Emergency Medicine | Admitting: Emergency Medicine

## 2023-04-14 ENCOUNTER — Encounter (HOSPITAL_COMMUNITY): Payer: Self-pay

## 2023-04-14 DIAGNOSIS — J111 Influenza due to unidentified influenza virus with other respiratory manifestations: Secondary | ICD-10-CM | POA: Diagnosis not present

## 2023-04-14 MED ORDER — GUAIFENESIN ER 600 MG PO TB12: 1200.0000 mg | ORAL_TABLET | Freq: Two times a day (BID) | ORAL | 0 refills | Status: AC

## 2023-04-14 MED ORDER — BENZONATATE 100 MG PO CAPS: 100.0000 mg | ORAL_CAPSULE | Freq: Three times a day (TID) | ORAL | 0 refills | Status: AC

## 2023-04-14 MED ORDER — ALBUTEROL SULFATE HFA 108 (90 BASE) MCG/ACT IN AERS: 1.0000 | INHALATION_SPRAY | Freq: Four times a day (QID) | RESPIRATORY_TRACT | 0 refills | Status: AC | PRN

## 2023-04-14 MED ORDER — OSELTAMIVIR PHOSPHATE 75 MG PO CAPS: 75.0000 mg | ORAL_CAPSULE | Freq: Two times a day (BID) | ORAL | 0 refills | Status: AC

## 2023-04-14 NOTE — Discharge Instructions (Addendum)
Your symptoms are consistent with a viral illness, most likely influenza due to your recent exposure.  Please alternate between and milligrams of ibuprofen and 500 mg of Tylenol every 4-6 hours for fever, headache, body aches and chills.  Mucinex can break up secretions as well as drinking 64 ounces of water daily and sleeping with a humidifier.  He can use the Tessalon as needed for cough.  Use your albuterol inhaler for any wheezing or shortness of breath.  Start the Tamiflu and take it as prescribed.  The flu is a viral illness and should get better over the next 5 to 7 days.  Please return to clinic or follow-up with your primary care provider for any new concerning symptoms or if no improvement.

## 2023-04-14 NOTE — ED Provider Notes (Signed)
[facility]    CSN: [id number] Arrival date & time: 04/14/23  1018      History   Chief Complaint Chief Complaint  Patient presents with   Shortness of Breath   Headache    HPI Anna Neal is a 43 y.o. female.   Patient presents to clinic complaining of fever, night sweats, diarrhea, headache, shortness of breath, cough, congestion and sore throat that started early yesterday morning around 1 AM.  She woke up with a fever of 105 and took ibuprofen, it improved.  Her children were sick recently with influenza.  She has not had any fever today, did take Advil around 9:30 AM.  Reports personal history of asthma, has never been formally diagnosed and she does not have an inhaler.  While taking the trash out to the dumpster she had to stop on her neighbors porch due to shortness of breath.  She is very congested.  The history is provided by the patient and medical records.  Shortness of Breath Headache   Past Medical History:  Diagnosis Date   Abnormal Pap smear    Anemia    Bronchitis    Heartburn during pregnancy    HPV in female    Pregnancy induced hypertension    with previous pregnancy   Vertigo     Patient Active Problem List   Diagnosis Date Noted   S/P cesarean section 08/17/2014   Evaluate anatomy not seen on prior sonogram    Encounter for fetal anatomic survey    Gestational hypertension    Cesarean delivery delivered 07/09/2013   Normal labor 07/07/2013   Trichomoniasis 02/18/2013   Supervision of other normal pregnancy 12/18/2012   Obesity complicating pregnancy 12/18/2012    Past Surgical History:  Procedure Laterality Date   CESAREAN SECTION N/A 07/09/2013   Procedure: CESAREAN SECTION;  Surgeon: Brock Bad, MD;  Location: [facility];  Service: Obstetrics;  Laterality: N/A;   CESAREAN SECTION WITH BILATERAL TUBAL LIGATION Bilateral 08/17/2014   Procedure: CESAREAN SECTION REPEAT WITH BILATERAL TUBAL LIGATION;  Surgeon: Catalina Antigua, MD;  Location: [facility];  Service: Obstetrics;  Laterality: Bilateral;  previous classic uterine incision. 36 week.    OB History     Gravida  4   Para  4   Term  3   Preterm  1   AB      Living  4      SAB      IAB      Ectopic      Multiple  0   Live Births  4            Home Medications    Prior to Admission medications   Medication Sig Start Date End Date Taking? Authorizing Provider  albuterol (VENTOLIN HFA) 108 (90 Base) MCG/ACT inhaler Inhale 1-2 puffs into the lungs every 6 (six) hours as needed for wheezing or shortness of breath. 04/14/23  Yes Rinaldo Ratel, [place] N, FNP  benzonatate (TESSALON) 100 MG capsule Take 1 capsule (100 mg total) by mouth every 8 (eight) hours. 04/14/23  Yes Rinaldo Ratel, [place] N, FNP  guaiFENesin (MUCINEX) 600 MG 12 hr tablet Take 2 tablets (1,200 mg total) by mouth 2 (two) times daily for 5 days. 04/14/23 04/19/23 Yes Rinaldo Ratel, [place] N, FNP  oseltamivir (TAMIFLU) 75 MG capsule Take 1 capsule (75 mg total) by mouth every 12 (twelve) hours. 04/14/23  Yes Rinaldo Ratel, [place] N, FNP  predniSONE (DELTASONE) 20 MG tablet Take 2 tablets (  40 mg total) by mouth daily. 08/06/22   Valinda Hoar, NP    Family History Family History  Problem Relation Age of Onset   Stroke Father    Asthma Maternal Grandmother    Alzheimer's disease Maternal Grandmother    Cancer Maternal Grandfather        throat and lung    Social History Social History   Tobacco Use   Smoking status: Never   Smokeless tobacco: Never  Substance Use Topics   Alcohol use: No   Drug use: No     Allergies   Patient has no known allergies.   Review of Systems Review of Systems  Per HPI   Physical Exam Triage Vital Signs ED Triage Vitals  Encounter Vitals Group     BP 04/14/23 1053 110/74     Systolic BP Percentile --      Diastolic BP Percentile --      Pulse Rate 04/14/23 1053 98     Resp 04/14/23 1053 16     Temp 04/14/23 1053 98.7 F (37.1  C)     Temp Source 04/14/23 1053 Oral     SpO2 04/14/23 1053 93 %     Weight --      Height --      Head Circumference --      Peak Flow --      Pain Score 04/14/23 1051 7     Pain Loc --      Pain Education --      Exclude from Growth Chart --    No data found.  Updated Vital Signs BP 110/74 (BP Location: Left Arm)   Pulse 98   Temp 98.7 F (37.1 C) (Oral)   Resp 16   LMP 04/02/2023 (Approximate)   SpO2 93%   Visual Acuity Right Eye Distance:   Left Eye Distance:   Bilateral Distance:    Right Eye Near:   Left Eye Near:    Bilateral Near:     Physical Exam Vitals and nursing note reviewed.  Constitutional:      Appearance: Normal appearance. She is well-developed.  HENT:     Head: Normocephalic and atraumatic.     Right Ear: External ear normal.     Left Ear: External ear normal.     Nose: Congestion and rhinorrhea present.     Mouth/Throat:     Mouth: Mucous membranes are moist.     Pharynx: Posterior oropharyngeal erythema present.  Eyes:     Conjunctiva/sclera: Conjunctivae normal.  Cardiovascular:     Rate and Rhythm: Normal rate and regular rhythm.     Heart sounds: Normal heart sounds. No murmur heard. Pulmonary:     Effort: Pulmonary effort is normal.     Breath sounds: No decreased breath sounds or wheezing.  Musculoskeletal:        General: Normal range of motion.  Skin:    General: Skin is warm and dry.  Neurological:     General: No focal deficit present.     Mental Status: She is alert and oriented to person, place, and time.  Psychiatric:        Mood and Affect: Mood normal.        Behavior: Behavior normal.      UC Treatments / Results  Labs (all labs ordered are listed, but only abnormal results are displayed) Labs Reviewed - No data to display  EKG   Radiology No results found.  Procedures Procedures (including critical  care time)  Medications Ordered in UC Medications - No data to display  Initial Impression /  Assessment and Plan / UC Course  I have reviewed the triage vital signs and the nursing notes.  Pertinent labs & imaging results that were available during my care of the patient were reviewed by me and considered in my medical decision making (see chart for details).  Vitals and triage reviewed, patient is hemodynamically stable.  Lungs are vesicular, heart with regular rate and rhythm.  Congestion, rhinorrhea and postnasal drip present on physical exam.  Symptoms consistent with viral URI, most likely influenza due to recent exposure.  Within the window for Tamiflu, will start on this.  Symptomatic management for congestion and cough reviewed.  No wheezing heard on my exam and oxygenation 96% on room air, will send in albuterol inhaler to use as needed.  Work note provided.  Plan of care, follow-up care return precautions given, no questions at this time.     Final Clinical Impressions(s) / UC Diagnoses   Final diagnoses:  Influenza-like illness     Discharge Instructions      Your symptoms are consistent with a viral illness, most likely influenza due to your recent exposure.  Please alternate between and milligrams of ibuprofen and 500 mg of Tylenol every 4-6 hours for fever, headache, body aches and chills.  Mucinex can break up secretions as well as drinking 64 ounces of water daily and sleeping with a humidifier.  He can use the Tessalon as needed for cough.  Use your albuterol inhaler for any wheezing or shortness of breath.  Start the Tamiflu and take it as prescribed.  The flu is a viral illness and should get better over the next 5 to 7 days.  Please return to clinic or follow-up with your primary care provider for any new concerning symptoms or if no improvement.     ED Prescriptions     Medication Sig Dispense Auth. Provider   oseltamivir (TAMIFLU) 75 MG capsule Take 1 capsule (75 mg total) by mouth every 12 (twelve) hours. 10 capsule Rinaldo Ratel, [place] N, FNP   guaiFENesin  (MUCINEX) 600 MG 12 hr tablet Take 2 tablets (1,200 mg total) by mouth 2 (two) times daily for 5 days. 20 tablet Rinaldo Ratel, [place] N, [place]   benzonatate (TESSALON) 100 MG capsule Take 1 capsule (100 mg total) by mouth every 8 (eight) hours. 21 capsule Rinaldo Ratel, [place] N, [place]   albuterol (VENTOLIN HFA) 108 (90 Base) MCG/ACT inhaler Inhale 1-2 puffs into the lungs every 6 (six) hours as needed for wheezing or shortness of breath. 18 g Osborn Pullin, [place] N, [place]      PDMP not reviewed this encounter.   Juvenal Umar, [place] N, [place] 04/14/23 1112

## 2023-04-14 NOTE — ED Triage Notes (Signed)
Pt c/o of diarrhea, dizziness, sweats, fever, headache, and shortness of breath upon exertion.   Pt's kids have tested positive for the flu.   Start Date: 04/13/2023  Home Interventions: Advil Liquid Gel Last Dose: 0930
